# Patient Record
Sex: Female | Born: 1965 | Race: White | Hispanic: No | Smoking: Never smoker
Health system: Southern US, Community
[De-identification: ages and names within clinical notes are randomized; demographics above are authoritative.]

## PROBLEM LIST (undated history)

## (undated) DIAGNOSIS — J302 Other seasonal allergic rhinitis: Secondary | ICD-10-CM

## (undated) DIAGNOSIS — K589 Irritable bowel syndrome without diarrhea: Secondary | ICD-10-CM

## (undated) DIAGNOSIS — R51 Headache: Secondary | ICD-10-CM

## (undated) DIAGNOSIS — I739 Peripheral vascular disease, unspecified: Secondary | ICD-10-CM

## (undated) DIAGNOSIS — M797 Fibromyalgia: Secondary | ICD-10-CM

## (undated) DIAGNOSIS — M1990 Unspecified osteoarthritis, unspecified site: Secondary | ICD-10-CM

## (undated) DIAGNOSIS — E785 Hyperlipidemia, unspecified: Secondary | ICD-10-CM

## (undated) DIAGNOSIS — I2699 Other pulmonary embolism without acute cor pulmonale: Secondary | ICD-10-CM

## (undated) DIAGNOSIS — Q627 Congenital vesico-uretero-renal reflux: Secondary | ICD-10-CM

## (undated) DIAGNOSIS — I341 Nonrheumatic mitral (valve) prolapse: Secondary | ICD-10-CM

## (undated) HISTORY — PX: HERNIA REPAIR: SHX51

## (undated) HISTORY — PX: OTHER SURGICAL HISTORY: SHX169

## (undated) HISTORY — PX: DIAGNOSTIC LAPAROSCOPY: SUR761

## (undated) HISTORY — PX: WISDOM TOOTH EXTRACTION: SHX21

## (undated) HISTORY — PX: UPPER GASTROINTESTINAL ENDOSCOPY: SHX188

## (undated) HISTORY — PX: TONSILLECTOMY: SUR1361

---

## 1998-09-26 ENCOUNTER — Other Ambulatory Visit: Admission: RE | Admit: 1998-09-26 | Discharge: 1998-09-26 | Payer: Self-pay | Admitting: *Deleted

## 1998-10-19 ENCOUNTER — Ambulatory Visit (HOSPITAL_COMMUNITY): Admission: RE | Admit: 1998-10-19 | Discharge: 1998-10-19 | Payer: Self-pay | Admitting: *Deleted

## 1998-10-24 ENCOUNTER — Ambulatory Visit (HOSPITAL_COMMUNITY): Admission: RE | Admit: 1998-10-24 | Discharge: 1998-10-24 | Payer: Self-pay | Admitting: *Deleted

## 1998-10-24 ENCOUNTER — Encounter: Payer: Self-pay | Admitting: *Deleted

## 1999-01-14 ENCOUNTER — Emergency Department (HOSPITAL_COMMUNITY): Admission: EM | Admit: 1999-01-14 | Discharge: 1999-01-15 | Payer: Self-pay | Admitting: Emergency Medicine

## 1999-12-07 ENCOUNTER — Other Ambulatory Visit: Admission: RE | Admit: 1999-12-07 | Discharge: 1999-12-07 | Payer: Self-pay | Admitting: *Deleted

## 2000-07-01 ENCOUNTER — Other Ambulatory Visit: Admission: RE | Admit: 2000-07-01 | Discharge: 2000-07-01 | Payer: Self-pay | Admitting: *Deleted

## 2001-03-26 ENCOUNTER — Other Ambulatory Visit: Admission: RE | Admit: 2001-03-26 | Discharge: 2001-03-26 | Payer: Self-pay | Admitting: Obstetrics and Gynecology

## 2002-03-25 ENCOUNTER — Other Ambulatory Visit: Admission: RE | Admit: 2002-03-25 | Discharge: 2002-03-25 | Payer: Self-pay | Admitting: Obstetrics and Gynecology

## 2003-06-16 ENCOUNTER — Other Ambulatory Visit: Admission: RE | Admit: 2003-06-16 | Discharge: 2003-06-16 | Payer: Self-pay | Admitting: Obstetrics and Gynecology

## 2005-08-30 HISTORY — PX: COLONOSCOPY: SHX174

## 2005-09-23 ENCOUNTER — Ambulatory Visit (HOSPITAL_COMMUNITY): Admission: RE | Admit: 2005-09-23 | Discharge: 2005-09-23 | Payer: Self-pay | Admitting: *Deleted

## 2008-08-30 DIAGNOSIS — I739 Peripheral vascular disease, unspecified: Secondary | ICD-10-CM

## 2008-08-30 DIAGNOSIS — I2699 Other pulmonary embolism without acute cor pulmonale: Secondary | ICD-10-CM

## 2008-08-30 HISTORY — DX: Other pulmonary embolism without acute cor pulmonale: I26.99

## 2008-08-30 HISTORY — DX: Peripheral vascular disease, unspecified: I73.9

## 2008-09-01 ENCOUNTER — Encounter: Admission: RE | Admit: 2008-09-01 | Discharge: 2008-09-01 | Payer: Self-pay | Admitting: Family Medicine

## 2008-09-01 ENCOUNTER — Inpatient Hospital Stay (HOSPITAL_COMMUNITY): Admission: AD | Admit: 2008-09-01 | Discharge: 2008-09-03 | Payer: Self-pay | Admitting: Internal Medicine

## 2008-09-02 ENCOUNTER — Encounter (INDEPENDENT_AMBULATORY_CARE_PROVIDER_SITE_OTHER): Payer: Self-pay | Admitting: Internal Medicine

## 2008-09-02 ENCOUNTER — Ambulatory Visit: Payer: Self-pay | Admitting: Vascular Surgery

## 2008-10-13 ENCOUNTER — Inpatient Hospital Stay (HOSPITAL_COMMUNITY): Admission: EM | Admit: 2008-10-13 | Discharge: 2008-10-15 | Payer: Self-pay | Admitting: Emergency Medicine

## 2008-10-14 ENCOUNTER — Encounter (INDEPENDENT_AMBULATORY_CARE_PROVIDER_SITE_OTHER): Payer: Self-pay | Admitting: Internal Medicine

## 2008-10-14 ENCOUNTER — Ambulatory Visit: Payer: Self-pay | Admitting: Vascular Surgery

## 2009-01-18 ENCOUNTER — Encounter: Admission: RE | Admit: 2009-01-18 | Discharge: 2009-01-18 | Payer: Self-pay | Admitting: Family Medicine

## 2009-05-15 ENCOUNTER — Encounter: Admission: RE | Admit: 2009-05-15 | Discharge: 2009-05-15 | Payer: Self-pay | Admitting: Family Medicine

## 2010-10-22 ENCOUNTER — Encounter: Admission: RE | Admit: 2010-10-22 | Discharge: 2010-10-22 | Payer: Self-pay | Admitting: Family Medicine

## 2010-12-03 ENCOUNTER — Ambulatory Visit (HOSPITAL_COMMUNITY)
Admission: RE | Admit: 2010-12-03 | Discharge: 2010-12-03 | Payer: Self-pay | Source: Home / Self Care | Admitting: General Surgery

## 2010-12-03 HISTORY — PX: CHOLECYSTECTOMY: SHX55

## 2011-03-12 LAB — CBC
HCT: 40.4 % (ref 36.0–46.0)
Hemoglobin: 14 g/dL (ref 12.0–15.0)
MCHC: 34.7 g/dL (ref 30.0–36.0)
MCV: 86.3 fL (ref 78.0–100.0)

## 2011-03-12 LAB — BASIC METABOLIC PANEL
CO2: 30 mEq/L (ref 19–32)
Chloride: 104 mEq/L (ref 96–112)
GFR calc non Af Amer: >60 mL/min (ref >60)
Glucose, Bld: 102 mg/dL — ABNORMAL HIGH (ref 70–99)
Potassium: 4.4 mEq/L (ref 3.5–5.1)
Sodium: 140 mEq/L (ref 135–145)

## 2011-03-12 LAB — SURGICAL PCR SCREEN: MRSA, PCR: NEGATIVE

## 2011-05-14 NOTE — H&P (Signed)
Alyssa [name], Alyssa [name]              ACCOUNT NO.:  [id number]   MEDICAL RECORD NO.:  [id number]          PATIENT TYPE:  INP   LOCATION:  2031                         FACILITY:  [facility]   PHYSICIAN:  Hillery Aldo, M.D.   DATE OF BIRTH:  April 09, 1966   DATE OF ADMISSION:  10/13/2008  DATE OF DISCHARGE:                              HISTORY & PHYSICAL   PRIMARY CARE PHYSICIAN:  Maryelizabeth Rowan, MD   CHIEF COMPLAINT:  Chest pressure x2 days, dyspnea.   HISTORY OF PRESENT ILLNESS:  The patient is a 45 year old female with a  past medical history of pulmonary embolism that diagnosed back in the  early part of September who comes in with a chief complaint of a 2-day  history of chest heaviness accompanied by shortness of breath that is  similar to the symptoms she had when she was diagnosed with her  pulmonary emboli.  She has been maintained on Coumadin since that time  and is therapeutic upon testing of her INR here in the emergency  department.  The patient also endorses cough with deep inspiration that  is nonproductive.  She specifically denies any fever, chills, or  myalgias.  Her appetite has been normal; however, she has lost  approximately 5 pounds intentionally with increasing her fluid intake.  She denies any nausea or vomiting.  She had transient diarrhea couple of  nights ago, but none since that time.  She felt lightheaded today, but  no complaints of headache.  She denies any hematochezia or frank blood  in the stool, but notes that her stool has been dark since starting the  Coumadin.  The patient saw her primary care physician at the office  today for a routine checkup and because of her complaints of chest pain,  a 12-lead EKG was done at the office.  There were some abnormalities  noted including Q-waves in leads III and aVF which prompted her primary  care physician to send her to the emergency department for further  evaluation and workup.   PAST MEDICAL HISTORY:  1.  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  2. Gastroesophageal reflux disease with history of treated      Helicobacter pylori infection.  3. Hypertriglyceridemia, treated.  4. Irritable bowel syndrome.  5. Mitral valve prolapse.  6. Nasal septal deviation.  7. Deep venous thrombosis/pulmonary embolism diagnosed in September      2009.  8. Migraine headaches.   PAST SURGICAL HISTORY:  1. Inguinal hernia repair.  2. Tonsillectomy.  3. Pilonidal cyst resection.  4. Cyst removed from back.   FAMILY HISTORY:  Both parents are alive and well.  The father is being  treated for high cholesterol.  Her paternal grandfather had a MI.  She  has 1 sister with Crohn disease.  Her other siblings are healthy.   SOCIAL HISTORY:  The patient is married.  She denies any tobacco or drug  use.  She drinks a glass of wine approximately 3 times per week.  She  has a Best boy in Audiological scientist.   ALLERGIES:  IMITREX causes her throat to swell.  CURRENT MEDICATIONS:  1. Amoxicillin 500 mg p.r.n. before oral procedures.  2. Biotin 5000 mg daily.  3. Calcium 800 mg b.i.d.  4. Claritin 10 mg daily.  5. Fenofibrate 160 mg daily.  6. Midrin 325/65/100 one tablet q.i.d. p.r.n.  7. Multivitamin daily.  8. Veramyst 27.5 mcg 2 sprays in each nostril daily.  9. Vitamin D3 1000 international units daily.  10.Coumadin 5 mg daily except on Tuesdays and Thursdays when she takes      7.5 mg.  11.Fish oil 1 g daily.   REVIEW OF SYSTEMS:  Comprehensive 14-point review of systems was  completed with a pertinent positives and negatives noted in the elements  of the HPI above.   PHYSICAL EXAM:  VITAL SIGNS:  Temperature 98.1, pulse 78, respirations  22, blood pressure 123/82, and O2 saturation 98% on room air.  GENERAL:  Well-developed, well-nourished female in no acute distress.  HEENT:  Normocephalic, atraumatic.  PERRL.  EOMI.  Oropharynx is clear.  NECK:  Supple, no thyromegaly, no  lymphadenopathy, no jugular venous  distention.  CHEST:  Lungs are clear to auscultation bilaterally with good air  movement.  HEART:  Regular rate, rhythm.  No murmurs, rubs, or gallops.  ABDOMEN:  Soft, nontender, nondistended with normoactive bowel sounds.  EXTREMITIES:  No clubbing, edema, or cyanosis.  SKIN:  Warm and dry.  No rashes.  NEUROLOGIC:  The patient is alert and oriented x3.  Nonfocal.   Data reviewed.   Chest x-ray shows no acute cardiopulmonary disease.   A 12-lead EKG shows normal sinus rhythm with small Q-waves noted in  leads III and aVF.   LABORATORY DATA:  White blood cell count is 6.4, hemoglobin 13.9,  hematocrit 41.4, and platelets 247.  PT/INR was 36.3 and 3.3.  Sodium is  139, potassium 5.0, chloride 104, bicarb 28, BUN 15, creatinine 0.93,  glucose 95.  BNP is less than 30.  Point of care cardiac markers are  negative x1.   ASSESSMENT AND PLAN:  1. Chest pain.  The patient with known pulmonary embolism:  The      patient will be admitted and a repeat CT angiogram will be obtained      to determine if she has any increase in her clot burden.  We will      also check lower extremity Doppler to determine her clot burden and      if it is significant, would consider a Greenfield filter placement.      Although this is not likely acute coronary artery syndrome given      her relative age, and the fact that she is a menstruating female,      would go ahead and cycle cardiac markers q.8 h. x3 sets.  2. Gastroesophageal reflux disease:  The patient is not currently on      any antireflux medicines and will add Protonix in case reflux is      contributing to her chest pain.  3. Hypertriglyceridemia:  Continue the patient's fish oil and      fenofibrate.  4. Deep venous thrombosis/pulmonary embolism:  As noted above.  We      will have pharmacy dose her Coumadin.  5. Migraine headaches:  Continue the patient's Midrin as needed.  6. Prophylaxis:  The patient  is currently fully coumadinized with a      therapeutic INR.  We will add a proton pump inhibitor for treatment      of her gastroesophageal reflux and  gastrointestinal stress ulcer      prophylaxis.      Hillery Aldo, M.D.  Electronically Signed     CR/MEDQ  D:  10/13/2008  T:  10/13/2008  Job:  [id number]   cc:   Maryelizabeth Rowan, M.D.

## 2011-05-14 NOTE — H&P (Signed)
Alyssa [name], Alyssa [name]              ACCOUNT NO.:  [id number]   MEDICAL RECORD NO.:  [id number]          PATIENT TYPE:  INP   LOCATION:  5124                         FACILITY:  [facility]   PHYSICIAN:  Peggye Pitt, M.D. DATE OF BIRTH:  [birth year]   DATE OF ADMISSION:  09/01/2008  DATE OF DISCHARGE:                              HISTORY & PHYSICAL   PRIMARY CARE PHYSICIAN:  Dr. Duanne Guess.   CHIEF COMPLAINT:  Right leg pain and shortness of breath.   HISTORY OF PRESENT ILLNESS:  Ms. Maahs is a 45 year old white woman  who presents with right leg pain for 1 week and for the same time frame  she has also been having shortness of breath, which she attributed to  her allergies.  She has had no associated chest pain, diaphoresis,  palpitations, fever, chills, or any other symptoms.  She visited her PCP  today who sent her to [facility] to have a CT scan of her chest done,  which demonstrated bilateral PE, which is the reason for her admission.  Of note, on August 27, 2008, she was on a plane trip from [place], and she is on oral anti-contraceptive pills.   ALLERGIES:  She has stated allergies to [facility].   PAST MEDICAL HISTORY:  Nonsignificant.   MEDICATIONS:  She takes none.   SOCIAL HISTORY:  She is married.  Lives with her husband, who is an  Airline pilot, has no children.  Drinks a glass of red wine every night.  Denies any tobacco or illicit drug use.   FAMILY HISTORY:  Significant for a stroke in her uncle in his late 61s.  A maternal grandmother with coronary artery disease, but no history of  cancer or blood clots in the family.   REVIEW OF SYSTEMS:  A 10-system review of systems was negative except as  per HPI.   PHYSICAL EXAMINATION:  VITAL SIGNS:  Upon admission, blood pressure  137/90, heart rate 93, respirations 20, and O2 sats 98% on room air with  a temperature of 97.9.  GENERAL:  She is alert, awake, and oriented x3.  Very pleasant and in no  acute distress.  HEENT:  Normocephalic and atraumatic.  Her pupils are equal and reactive  to light and accommodation with intact extraocular movements.  Neck:  Supple with no JVD.  No lymphadenopathy.  No bruits or goiter.  LUNGS:  Clear to auscultation bilaterally.  CARDIOVASCULAR:  Regular rate and rhythm with no murmurs, rubs, or  gallops auscultated.  ABDOMEN:  Soft, nontender, and nondistended with positive bowel sounds.  EXTREMITIES:  She has about 1-2+ nonpitting edema over her right calf  and knee.  None over her left.  Her right calf is very tender to  palpation, and she has a positive Homans sign.   LABORATORIES UPON ADMISSION:  She has had none.   A CT scan, which the report is not up in e-chart yet, but has been read  as bilateral PEs.   ASSESSMENT AND PLAN:  1. For her bilateral pulmonary embolisms, which have been documented      by her CT  scan of her chest at this time, we will discontinue any      further hormonal contraception.  We will check hypercoagulable      panel.  We will start on warfarin and full-dose anticoagulation      with Lovenox.  We will bridge with Lovenox until her INR is      therapeutic for at least 2 days.  She is willing to give shots at      home, so we may be able to D/C her in the a.m.  Close follow up      with her PCP for INR checks.  2. For prophylaxis while in the hospital for deep venous thrombosis,      she will be replaced on full-dose anticoagulation for her pulmonary      embolism.  For gastrointestinal prophylaxis, we will place her on      Protonix.      Peggye Pitt, M.D.  Electronically Signed     EH/MEDQ  D:  09/01/2008  T:  09/02/2008  Job:  [id number]

## 2011-05-17 NOTE — Op Note (Signed)
Alyssa [name], Alyssa [name]               ACCOUNT NO.:  [id number]   MEDICAL RECORD NO.:  [id number]          PATIENT TYPE:  AMB   LOCATION:  ENDO                         FACILITY:  [facility]   PHYSICIAN:  Georgiana Spinner, M.D.    DATE OF BIRTH:  1966-01-10   DATE OF PROCEDURE:  09/23/2005  DATE OF DISCHARGE:                                 OPERATIVE REPORT   PROCEDURE:  Colonoscopy.   INDICATIONS:  Rectal bleeding.   ANESTHESIA:  Demerol 70, Versed 8 mg.   PROCEDURE:  With patient mildly sedated in the left lateral decubitus  position, the Olympus videoscopic colonoscope was inserted in the rectum and  passed under direct vision to the cecum, identified by ileocecal valve and  appendiceal orifice, both which were photographed.  From this point, the  colonoscope was slowly withdrawn taking circumferential views of colonic  mucosa, stopping only to wash and suction fecal debris until we reached the  rectum which appeared normal on direct and retroflexed view.  The endoscope  was straightened, withdrawn.  The patient's vital signs, pulse oximeter  remained stable.  The patient tolerated the procedure well without apparent  complications.   FINDINGS:  Rather unremarkable examination, limited mildly by prep.   PLAN:  Consider repeat examination in 5-10 years or as needed.           ______________________________  Georgiana Spinner, M.D.     GMO/MEDQ  D:  09/23/2005  T:  09/23/2005  Job:  [id number]

## 2011-05-17 NOTE — Discharge Summary (Signed)
NAMEZEOLA, BRYS              ACCOUNT NO.:  [id number]   MEDICAL RECORD NO.:  [id number]          PATIENT TYPE:  INP   LOCATION:  5124                         FACILITY:  [facility]   PHYSICIAN:  Lonia Blood, M.D.       DATE OF BIRTH:  May 13, 1966   DATE OF ADMISSION:  09/01/2008  DATE OF DISCHARGE:  09/03/2008                               DISCHARGE SUMMARY   PRIMARY CARE PHYSICIAN:  Maryelizabeth Rowan, MD   DISCHARGE DIAGNOSES:  1. Pulmonary emboli.  2. Deep venous thrombosis of the right lower extremity.  3. Migraine headaches.   DISCHARGE MEDICATIONS:  1. Lovenox 1 mg/kg body every 12 hours until PT/INR therapeutic.  2. Coumadin 5 mg at 6:00 p.m. daily.   CONDITION ON DISCHARGE:  Ms. Mian is discharged in good condition.  She is alert and oriented without any signs of respiratory distress.   The patient will follow up with her primary care physician Dr. Maryelizabeth Rowan the day after the discharge, which is September 04, 2008.   PROCEDURE DURING THIS ADMISSION:  1. Ms. Lipuma underwent a CT scan of the chest with intravenous      contrast, which was positive for bilateral pulmonary emboli.  2. The patient underwent lower extremity venous Dopplers, which were      positive for acute deep venous thrombosis in the posterior tibial      vein and popliteal vein to the distal femoral vein.   HISTORY AND PHYSICAL:  Refer to dictated H&P done by Dr. Ardyth Harps.   HOSPITAL COURSE:  Pulmonary emboli.  Ms. Krizek was admitted from the  emergency room after she presented with complaints of some shortness of  breath and chest pain.  She was diagnosed with bilateral pulmonary  emboli and was placed in the hospital on subcutaneous Lovenox and oral  Coumadin.  The patient had brief decrease in her thrombocyte count, but  then the platelet count recovered nicely and she was able to discharge  home in good  condition.  Ms. Sonnen had a hypercoagulable screen, which did not turn  positive  for protein C, protein S deficiency, and/or factor V mutation  or prothrombin II gene mutation.  The patient was also screened negative  for lupus anticoagulant presence.      Lonia Blood, M.D.  Electronically Signed     SL/MEDQ  D:  09/10/2008  T:  09/11/2008  Job:  [id number]   cc:   Maryelizabeth Rowan, M.D.

## 2011-10-01 LAB — CARDIAC PANEL(CRET KIN+CKTOT+MB+TROPI)
CK, MB: 0.7
Total CK: 70
Troponin I: 0.01

## 2011-10-01 LAB — CBC
HCT: 35.9 — ABNORMAL LOW
HCT: 41.1
MCV: 87.1
Platelets: 211
RBC: 4.72
RDW: 12.5
WBC: 6.4

## 2011-10-01 LAB — BASIC METABOLIC PANEL
BUN: 15
CO2: 28
Chloride: 104
Potassium: 5

## 2011-10-01 LAB — CK TOTAL AND CKMB (NOT AT ARMC)
CK, MB: 0.7
Relative Index: INVALID
Total CK: 76

## 2011-10-01 LAB — POCT CARDIAC MARKERS
CKMB, poc: <1 — ABNORMAL LOW
Troponin i, poc: <0.05

## 2011-10-01 LAB — DIFFERENTIAL
Eosinophils Absolute: 0.1
Eosinophils Relative: 1
Lymphs Abs: 2
Monocytes Relative: 10

## 2011-10-01 LAB — PROTIME-INR
INR: 2.3 — ABNORMAL HIGH
INR: 2.9 — ABNORMAL HIGH

## 2011-10-02 LAB — PROTIME-INR
INR: 1
INR: 1
Prothrombin Time: 13.9
Prothrombin Time: 15.1

## 2011-10-02 LAB — LUPUS ANTICOAGULANT PANEL
DRVVT: 53.1 — ABNORMAL HIGH (ref 36.1–47.0)
Lupus Anticoagulant: NOT DETECTED
dRVVT Incubated 1:1 Mix: 41.7 (ref 36.1–47.0)

## 2011-10-02 LAB — CBC
Hemoglobin: 11.9 — ABNORMAL LOW
MCHC: 33.9
MCHC: 35.1
MCV: 88.9
Platelets: 156
Platelets: 173
RBC: 3.98
RDW: 12
RDW: 12.1
RDW: 12.1

## 2011-10-02 LAB — PROTHROMBIN GENE MUTATION

## 2011-10-02 LAB — COMPREHENSIVE METABOLIC PANEL
ALT: 24
Albumin: 3.6
Alkaline Phosphatase: 48
Calcium: 9.1
Potassium: 3.9
Sodium: 140
Total Protein: 6.5

## 2011-10-02 LAB — PROTEIN C ACTIVITY: Protein C Activity: 153 % — ABNORMAL HIGH (ref 75–133)

## 2011-10-02 LAB — BETA-2-GLYCOPROTEIN I ABS, IGG/M/A: Beta-2 Glyco I IgG: <4 U/mL (ref <20)

## 2011-10-02 LAB — HOMOCYSTEINE: Homocysteine: 6.8

## 2011-10-02 LAB — CARDIOLIPIN ANTIBODIES, IGG, IGM, IGA: Anticardiolipin IgG: <7 — ABNORMAL LOW (ref <11)

## 2011-10-02 LAB — APTT: aPTT: 30

## 2011-12-11 ENCOUNTER — Encounter (HOSPITAL_COMMUNITY): Payer: Self-pay

## 2011-12-19 ENCOUNTER — Other Ambulatory Visit: Payer: Self-pay | Admitting: Obstetrics and Gynecology

## 2011-12-19 NOTE — Patient Instructions (Addendum)
   Your procedure is scheduled on: Thursday, Dec 27th  Enter through the Main Entrance of [facility] at: 10:00am Pick up the phone at the desk and dial [phone] and inform us of your arrival.  Please call this number if you have any problems the morning of surgery: [phone]  Remember: Do not eat food after midnight: Wednesday Do not drink clear liquids after: Wednesday Take these medicines the morning of surgery with a SIP OF WATER: allergy meds  Do not wear jewelry, make-up, or FINGER nail polish Do not wear lotions, powders, perfumes or deodorant. Do not shave 48 hours prior to surgery. Do not bring valuables to the hospital.  Patients discharged on the day of surgery will not be allowed to drive home.   Home with husband  Maurine Minister cell [phone]  Remember to use your hibiclens as instructed.Please shower with 1/2 bottle the evening before your surgery and the other 1/2 bottle the morning of surgery.

## 2011-12-20 ENCOUNTER — Encounter (HOSPITAL_COMMUNITY): Payer: Self-pay

## 2011-12-20 ENCOUNTER — Encounter (HOSPITAL_COMMUNITY)
Admission: RE | Admit: 2011-12-20 | Discharge: 2011-12-20 | Disposition: A | Discharge status: Home / Self Care | Payer: 59 | Source: Ambulatory Visit | Attending: Obstetrics and Gynecology | Admitting: Obstetrics and Gynecology

## 2011-12-20 HISTORY — DX: Irritable bowel syndrome, unspecified: K58.9

## 2011-12-20 HISTORY — DX: Headache: R51

## 2011-12-20 HISTORY — DX: Other seasonal allergic rhinitis: J30.2

## 2011-12-20 HISTORY — DX: Unspecified osteoarthritis, unspecified site: M19.90

## 2011-12-20 HISTORY — DX: Fibromyalgia: M79.7

## 2011-12-20 HISTORY — DX: Hyperlipidemia, unspecified: E78.5

## 2011-12-20 HISTORY — DX: Congenital vesico-uretero-renal reflux: Q62.7

## 2011-12-20 HISTORY — DX: Other pulmonary embolism without acute cor pulmonale: I26.99

## 2011-12-20 HISTORY — DX: Nonrheumatic mitral (valve) prolapse: I34.1

## 2011-12-20 HISTORY — DX: Peripheral vascular disease, unspecified: I73.9

## 2011-12-20 LAB — CBC
Platelets: 267 10*3/uL (ref 150–400)
RBC: 4.49 MIL/uL (ref 3.87–5.11)
RDW: 12.8 % (ref 11.5–15.5)
WBC: 5.5 10*3/uL (ref 4.0–10.5)

## 2011-12-20 NOTE — Pre-Procedure Instructions (Signed)
Reviewed Patient's history with Dr Rodman Pickle.  Ok to see DOS.  Only need a CBC.

## 2011-12-25 NOTE — H&P (Signed)
NAMESHELMA, [name]              ACCOUNT NO.:  [id number]  MEDICAL RECORD NO.:  [id number]  LOCATION:  PERIO                         FACILITY:  [facility]  PHYSICIAN:  Lenoard Aden, M.D.DATE OF BIRTH:  October 22, 1966  DATE OF ADMISSION:  12/05/2011 DATE OF DISCHARGE:                             HISTORY & PHYSICAL   CHIEF COMPLAINT:  Refractory menorrhagia.  HISTORY OF PRESENT ILLNESS:  The patient is a 45 year old white female, G1, P0, who presents for definitive therapy of refractory menorrhagia.  She has a surgical history remarkable for: 1. Cholecystectomy. 2. Laparoscopy. 3. Tonsillectomy. 4. Hernia repair. 5. Pilonidal cyst removal __________ in addition to a D and A.  She is a nonsmoker, nondrinker.  She denies domestic or physical violence.  FAMILY HISTORY:  Breast cancer, stroke, and prostate and lung cancer.  MEDICATIONS:  Include multivitamin, calcium, Midrin p.r.n., Singulair p.r.n., Veramyst, and Reglan p.r.n.  PHYSICAL EXAMINATION:  GENERAL:  The patient is a well-developed, well- nourished, white female, in no acute distress.  Height of 66 inches. Weight of 176 pounds. HEENT:  Normal. NECK:  Supple.  Full range of motion. LUNGS:  Clear. HEART:  Regular rhythm. ABDOMEN:  Soft, nontender. PELVIC:  There is a small anteflexed uterus. EXTREMITIES:  No cords. NEUROLOGIC:  Nonfocal. SKIN:  Intact.  IMPRESSION:  Refractory menorrhagia for definitive therapy.  PLAN:  Proceed with diagnostic hysteroscopy, D and C, NovaSure endometrial ablation.  Risks of anesthesia, infection, bleeding, injury to abdominal organs, need for repair was discussed, delayed versus immediate complications to include bowel or bladder injury noted.  The patient acknowledges and wishes to proceed.     Lenoard Aden, M.D.     RJT/MEDQ  D:  12/25/2011  T:  12/25/2011  Job:  [id number]

## 2011-12-26 ENCOUNTER — Ambulatory Visit (HOSPITAL_COMMUNITY)
Admission: RE | Admit: 2011-12-26 | Discharge: 2011-12-26 | Disposition: A | Discharge status: Home / Self Care | Payer: 59 | Source: Ambulatory Visit | Attending: Obstetrics and Gynecology | Admitting: Obstetrics and Gynecology

## 2011-12-26 ENCOUNTER — Encounter (HOSPITAL_COMMUNITY)
Admission: RE | Disposition: A | Discharge status: Home / Self Care | Payer: Self-pay | Source: Ambulatory Visit | Attending: Obstetrics and Gynecology

## 2011-12-26 ENCOUNTER — Other Ambulatory Visit: Payer: Self-pay | Admitting: Obstetrics and Gynecology

## 2011-12-26 ENCOUNTER — Encounter (HOSPITAL_COMMUNITY): Payer: Self-pay | Admitting: *Deleted

## 2011-12-26 ENCOUNTER — Encounter (HOSPITAL_COMMUNITY): Payer: Self-pay | Admitting: Anesthesiology

## 2011-12-26 ENCOUNTER — Ambulatory Visit (HOSPITAL_COMMUNITY): Payer: 59 | Admitting: Anesthesiology

## 2011-12-26 DIAGNOSIS — N920 Excessive and frequent menstruation with regular cycle: Secondary | ICD-10-CM | POA: Insufficient documentation

## 2011-12-26 LAB — HCG, SERUM, QUALITATIVE: Preg, Serum: NEGATIVE

## 2011-12-26 SURGERY — DILATATION & CURETTAGE/HYSTEROSCOPY WITH NOVASURE ABLATION
Anesthesia: General | Site: Vagina | Wound class: Clean Contaminated

## 2011-12-26 MED ORDER — MIDAZOLAM HCL 5 MG/5ML IJ SOLN
INTRAMUSCULAR | Status: DC | PRN
  Administered 2011-12-26: 2 mg via INTRAVENOUS

## 2011-12-26 MED ORDER — LIDOCAINE HCL (CARDIAC) 20 MG/ML IV SOLN
INTRAVENOUS | Status: AC
  Filled 2011-12-26: qty 5

## 2011-12-26 MED ORDER — FENTANYL CITRATE 0.05 MG/ML IJ SOLN
INTRAMUSCULAR | Status: AC
  Filled 2011-12-26: qty 2

## 2011-12-26 MED ORDER — FENTANYL CITRATE 0.05 MG/ML IJ SOLN
25.0000 ug | INTRAMUSCULAR | Status: DC | PRN
  Administered 2011-12-26: 50 ug via INTRAVENOUS

## 2011-12-26 MED ORDER — DEXAMETHASONE SODIUM PHOSPHATE 4 MG/ML IJ SOLN
INTRAMUSCULAR | Status: DC | PRN
  Administered 2011-12-26: 4 mg via INTRAVENOUS

## 2011-12-26 MED ORDER — CEFAZOLIN SODIUM 1-5 GM-% IV SOLN
1.0000 g | INTRAVENOUS | Status: AC
  Administered 2011-12-26: 1 g via INTRAVENOUS

## 2011-12-26 MED ORDER — DEXAMETHASONE SODIUM PHOSPHATE 10 MG/ML IJ SOLN
INTRAMUSCULAR | Status: AC
  Filled 2011-12-26: qty 1

## 2011-12-26 MED ORDER — DEXAMETHASONE SODIUM PHOSPHATE 4 MG/ML IJ SOLN: 8.0000 mg | Freq: Once | INTRAMUSCULAR | Status: DC | PRN

## 2011-12-26 MED ORDER — KETOROLAC TROMETHAMINE 30 MG/ML IJ SOLN: 15.0000 mg | Freq: Once | INTRAMUSCULAR | Status: DC | PRN

## 2011-12-26 MED ORDER — ONDANSETRON HCL 4 MG/2ML IJ SOLN
INTRAMUSCULAR | Status: AC
  Filled 2011-12-26: qty 2

## 2011-12-26 MED ORDER — FENTANYL CITRATE 0.05 MG/ML IJ SOLN
INTRAMUSCULAR | Status: AC
  Filled 2011-12-26: qty 4

## 2011-12-26 MED ORDER — MEPERIDINE HCL 25 MG/ML IJ SOLN: 6.2500 mg | INTRAMUSCULAR | Status: DC | PRN

## 2011-12-26 MED ORDER — PROPOFOL 10 MG/ML IV EMUL
INTRAVENOUS | Status: AC
  Filled 2011-12-26: qty 20

## 2011-12-26 MED ORDER — MIDAZOLAM HCL 2 MG/2ML IJ SOLN
INTRAMUSCULAR | Status: AC
  Filled 2011-12-26: qty 2

## 2011-12-26 MED ORDER — FENTANYL CITRATE 0.05 MG/ML IJ SOLN
INTRAMUSCULAR | Status: DC | PRN
  Administered 2011-12-26 (×3): 50 ug via INTRAVENOUS

## 2011-12-26 MED ORDER — OXYCODONE-ACETAMINOPHEN 5-325 MG PO TABS: 1.0000 | ORAL_TABLET | ORAL | Status: AC | PRN

## 2011-12-26 MED ORDER — LACTATED RINGERS IV SOLN
INTRAVENOUS | Status: DC
  Administered 2011-12-26 (×2): via INTRAVENOUS

## 2011-12-26 MED ORDER — KETOROLAC TROMETHAMINE 30 MG/ML IJ SOLN
INTRAMUSCULAR | Status: AC
  Filled 2011-12-26: qty 1

## 2011-12-26 MED ORDER — SCOPOLAMINE 1 MG/3DAYS TD PT72
MEDICATED_PATCH | TRANSDERMAL | Status: AC
  Administered 2011-12-26: 1 via TRANSDERMAL
  Filled 2011-12-26: qty 1

## 2011-12-26 MED ORDER — KETOROLAC TROMETHAMINE 30 MG/ML IJ SOLN
INTRAMUSCULAR | Status: DC | PRN
  Administered 2011-12-26: 30 mg via INTRAVENOUS

## 2011-12-26 MED ORDER — LACTATED RINGERS IR SOLN
Status: DC | PRN
  Administered 2011-12-26: 3000 mL

## 2011-12-26 MED ORDER — PROPOFOL 10 MG/ML IV EMUL
INTRAVENOUS | Status: DC | PRN
  Administered 2011-12-26: 150 mg via INTRAVENOUS

## 2011-12-26 MED ORDER — CEFAZOLIN SODIUM 1-5 GM-% IV SOLN
INTRAVENOUS | Status: AC
  Filled 2011-12-26: qty 50

## 2011-12-26 MED ORDER — BUPIVACAINE HCL (PF) 0.25 % IJ SOLN
INTRAMUSCULAR | Status: DC | PRN
  Administered 2011-12-26: 20 mL

## 2011-12-26 MED ORDER — LIDOCAINE HCL (CARDIAC) 20 MG/ML IV SOLN
INTRAVENOUS | Status: DC | PRN
  Administered 2011-12-26: 100 mg via INTRAVENOUS

## 2011-12-26 SURGICAL SUPPLY — 14 items
ABLATOR ENDOMETRIAL BIPOLAR (ABLATOR) ×2 IMPLANT
CATH ROBINSON RED A/P 16FR (CATHETERS) ×2 IMPLANT
CLOTH BEACON ORANGE TIMEOUT ST (SAFETY) ×2 IMPLANT
CONTAINER PREFILL 10% NBF 60ML (FORM) ×4 IMPLANT
GLOVE BIO SURGEON STRL SZ7.5 (GLOVE) ×4 IMPLANT
GOWN PREVENTION PLUS LG XLONG (DISPOSABLE) ×2 IMPLANT
GOWN PREVENTION PLUS XLARGE (GOWN DISPOSABLE) ×2 IMPLANT
GOWN STRL REIN XL XLG (GOWN DISPOSABLE) ×2 IMPLANT
NEEDLE SPNL 22GX3.5 QUINCKE BK (NEEDLE) ×2 IMPLANT
PACK HYSTEROSCOPY LF (CUSTOM PROCEDURE TRAY) ×2 IMPLANT
PAD PREP 24X48 CUFFED NSTRL (MISCELLANEOUS) ×2 IMPLANT
SYR TB 1ML 25GX5/8 (SYRINGE) ×2 IMPLANT
TOWEL OR 17X24 6PK STRL BLUE (TOWEL DISPOSABLE) ×4 IMPLANT
WATER STERILE IRR 1000ML POUR (IV SOLUTION) ×2 IMPLANT

## 2011-12-26 NOTE — Transfer of Care (Signed)
Immediate Anesthesia Transfer of Care Note  Patient: Alyssa [name]  Procedure(s) Performed:  DILATATION & CURETTAGE/HYSTEROSCOPY WITH NOVASURE ABLATION  Patient Location: PACU  Anesthesia Type: General  Level of Consciousness: awake, alert , oriented and patient cooperative  Airway & Oxygen Therapy: Patient Spontanous Breathing and Patient connected to nasal cannula oxygen  Post-op Assessment: Report given to PACU RN and Post -op Vital signs reviewed and stable  Post vital signs: Reviewed and stable  Complications: No apparent anesthesia complications

## 2011-12-26 NOTE — Anesthesia Preprocedure Evaluation (Signed)
Anesthesia Evaluation  Patient identified by MRN, date of birth, ID band Patient awake    Reviewed: Allergy & Precautions, H&P , NPO status , Patient's Chart, lab work & pertinent test results  Airway Mallampati: I TM Distance: >3 FB Neck ROM: full    Dental No notable dental hx. (+) Teeth Intact   Pulmonary neg pulmonary ROS,    Pulmonary exam normal       Cardiovascular neg cardio ROS     Neuro/Psych Negative Psych ROS   GI/Hepatic negative GI ROS, Neg liver ROS,   Endo/Other  Negative Endocrine ROS  Renal/GU negative Renal ROS  Genitourinary negative   Musculoskeletal   Abdominal Normal abdominal exam  (+)   Peds negative pediatric ROS (+)  Hematology negative hematology ROS (+)   Anesthesia Other Findings   Reproductive/Obstetrics negative OB ROS                           Anesthesia Physical Anesthesia Plan  ASA: II  Anesthesia Plan: General   Post-op Pain Management:    Induction: Intravenous  Airway Management Planned: LMA  Additional Equipment:   Intra-op Plan:   Post-operative Plan:   Informed Consent: I have reviewed the patients History and Physical, chart, labs and discussed the procedure including the risks, benefits and alternatives for the proposed anesthesia with the patient or authorized representative who has indicated his/her understanding and acceptance.     Plan Discussed with: CRNA  Anesthesia Plan Comments:         Anesthesia Quick Evaluation

## 2011-12-26 NOTE — Anesthesia Postprocedure Evaluation (Signed)
Anesthesia Post Note  Patient: Alyssa [name]  Procedure(s) Performed:  DILATATION & CURETTAGE/HYSTEROSCOPY WITH NOVASURE ABLATION  Anesthesia type: General  Patient location: PACU  Post pain: Pain level controlled  Post assessment: Post-op Vital signs reviewed  Last Vitals:  Filed Vitals:   12/26/11 0952  BP: 141/91  Pulse: 84  Temp: 36.7 C  Resp: 18    Post vital signs: Reviewed  Level of consciousness: sedated  Complications: No apparent anesthesia complicationsfj

## 2011-12-26 NOTE — Anesthesia Procedure Notes (Signed)
Procedure Name: LMA Insertion Performed by: Erskine Speed Pre-anesthesia Checklist: Patient identified, Patient being monitored, Emergency Drugs available, Timeout performed and Suction available Patient Re-evaluated:Patient Re-evaluated prior to inductionOxygen Delivery Method: Circle System Utilized Preoxygenation: Pre-oxygenation with 100% oxygen Intubation Type: Combination inhalational/ intravenous induction Ventilation: Mask ventilation without difficulty LMA: LMA flexible inserted LMA Size: 4.0 Number of attempts: 1 Airway Equipment and Method: patient positioned with wedge pillow

## 2011-12-26 NOTE — Preoperative (Signed)
Beta Blockers   Reason not to administer Beta Blockers:Not Applicable 

## 2011-12-26 NOTE — Progress Notes (Signed)
Patient ID: Alyssa [name], female   DOB: 1966-01-07, 45 y.o.   MRN: [id number] Consent done. Pt examined. Update done. No changes noted.

## 2011-12-26 NOTE — Op Note (Signed)
12/26/2011  11:13 AM  PATIENT:  Alyssa [name]  45 y.o. female  PRE-OPERATIVE DIAGNOSIS:  Menorrhagia(refractory)  POST-OPERATIVE DIAGNOSIS:  Same  PROCEDURE:  Procedure(s): DILATATION & CURETTAGE/HYSTEROSCOPY WITH NOVASURE ABLATION  SURGEON:  Surgeon(s): Lenoard Aden, MD  ASSISTANTS: none   ANESTHESIA:   local and general  ESTIMATED BLOOD LOSS: minimal Fluid deficit less than 50ml.  DRAINS: none   LOCAL MEDICATIONS USED:  MARCAINE 20CC  SPECIMEN:  Source of Specimen:  EMC  DISPOSITION OF SPECIMEN:  PATHOLOGY  COUNTS:  YES  DICTATION #: [id number]  PLAN OF CARE: DC home  PATIENT DISPOSITION:  PACU - hemodynamically stable.

## 2011-12-27 NOTE — Op Note (Signed)
[name], [name]              ACCOUNT NO.:  [id number]  MEDICAL RECORD NO.:  [id number]  LOCATION:  [facility]                          FACILITY:  [facility]  PHYSICIAN:  Lenoard Aden, M.D.DATE OF BIRTH:  [birth year]  DATE OF PROCEDURE:  12/26/2011 DATE OF DISCHARGE:  12/26/2011                              OPERATIVE REPORT   DESCRIPTION OF PROCEDURE:  After being apprised of risks of anesthesia, infection, bleeding, injury to abdominal organs, possible uterine perforation, and need for repair, the patient was brought the operating room and was administered general anesthetic without complications. Prepped and draped in sterile fashion.  Catheterized until the bladder was empty.  Exam under anesthesia reveals a small anteflexed uterus and no adnexal masses.  At this time, a dilute Marcaine solution was placed for a paracervical block and cervix is easily dilated up to a #23 Pratt dilator.  Hysteroscope placed.  Visualization reveals a normal endometrial cavity.  Endometrial curettings are collected using sharp curettage in a 4-quadrant method.  Sent to pathology.  The NovaSure device is then entered, set to a length of 5 cm, a width of 2.6 cm and initiated after the CO2 test is negative to a power of 72 watts for 1 minute and 40 seconds.  At the termination of the procedure,  NovaSure device was removed inspected and found to be intact.  Revisualization of endometrial cavity reveals a well-ablated endometrial cavity.  Good hemostasis was noted.  No evidence of uterine perforation.  All instruments were removed.  The patient tolerated the procedure well, was awakened, and transferred to recovery in good condition.     Lenoard Aden, M.D.     RJT/MEDQ  D:  12/26/2011  T:  12/27/2011  Job:  [id number]

## 2014-06-24 ENCOUNTER — Ambulatory Visit (HOSPITAL_COMMUNITY): Payer: BC Managed Care – PPO | Attending: Cardiology | Admitting: Cardiology

## 2014-06-24 ENCOUNTER — Other Ambulatory Visit (HOSPITAL_COMMUNITY): Payer: Self-pay | Admitting: Cardiology

## 2014-06-24 DIAGNOSIS — M79606 Pain in leg, unspecified: Secondary | ICD-10-CM

## 2014-06-24 DIAGNOSIS — M79609 Pain in unspecified limb: Secondary | ICD-10-CM | POA: Insufficient documentation

## 2014-06-24 DIAGNOSIS — Z86718 Personal history of other venous thrombosis and embolism: Secondary | ICD-10-CM | POA: Insufficient documentation

## 2014-06-24 DIAGNOSIS — M7989 Other specified soft tissue disorders: Secondary | ICD-10-CM

## 2014-06-24 NOTE — Progress Notes (Signed)
Rt. LE venous duplex performed. Prelim. Called to Dr. Chauncy Passyewey's office.

## 2016-04-08 DIAGNOSIS — R197 Diarrhea, unspecified: Secondary | ICD-10-CM | POA: Diagnosis not present

## 2016-04-22 DIAGNOSIS — D122 Benign neoplasm of ascending colon: Secondary | ICD-10-CM | POA: Diagnosis not present

## 2016-04-22 DIAGNOSIS — K640 First degree hemorrhoids: Secondary | ICD-10-CM | POA: Diagnosis not present

## 2016-04-22 DIAGNOSIS — R197 Diarrhea, unspecified: Secondary | ICD-10-CM | POA: Diagnosis not present

## 2016-04-22 DIAGNOSIS — K5730 Diverticulosis of large intestine without perforation or abscess without bleeding: Secondary | ICD-10-CM | POA: Diagnosis not present

## 2016-05-03 DIAGNOSIS — R197 Diarrhea, unspecified: Secondary | ICD-10-CM | POA: Diagnosis not present

## 2016-06-28 DIAGNOSIS — R197 Diarrhea, unspecified: Secondary | ICD-10-CM | POA: Diagnosis not present

## 2016-12-10 DIAGNOSIS — Z1231 Encounter for screening mammogram for malignant neoplasm of breast: Secondary | ICD-10-CM | POA: Diagnosis not present

## 2016-12-10 DIAGNOSIS — Z1151 Encounter for screening for human papillomavirus (HPV): Secondary | ICD-10-CM | POA: Diagnosis not present

## 2016-12-10 DIAGNOSIS — Z01419 Encounter for gynecological examination (general) (routine) without abnormal findings: Secondary | ICD-10-CM | POA: Diagnosis not present

## 2016-12-10 DIAGNOSIS — Z6830 Body mass index (BMI) 30.0-30.9, adult: Secondary | ICD-10-CM | POA: Diagnosis not present

## 2016-12-10 DIAGNOSIS — Z113 Encounter for screening for infections with a predominantly sexual mode of transmission: Secondary | ICD-10-CM | POA: Diagnosis not present

## 2017-10-13 DIAGNOSIS — E782 Mixed hyperlipidemia: Secondary | ICD-10-CM | POA: Diagnosis not present

## 2017-10-13 DIAGNOSIS — I10 Essential (primary) hypertension: Secondary | ICD-10-CM | POA: Diagnosis not present

## 2017-10-13 DIAGNOSIS — M179 Osteoarthritis of knee, unspecified: Secondary | ICD-10-CM | POA: Diagnosis not present

## 2017-10-13 DIAGNOSIS — Z6829 Body mass index (BMI) 29.0-29.9, adult: Secondary | ICD-10-CM | POA: Diagnosis not present

## 2017-10-13 DIAGNOSIS — Z23 Encounter for immunization: Secondary | ICD-10-CM | POA: Diagnosis not present

## 2017-11-10 DIAGNOSIS — Z0000 Encounter for general adult medical examination without abnormal findings: Secondary | ICD-10-CM | POA: Diagnosis not present

## 2017-11-10 DIAGNOSIS — Z13220 Encounter for screening for lipoid disorders: Secondary | ICD-10-CM | POA: Diagnosis not present

## 2017-11-10 DIAGNOSIS — Z1329 Encounter for screening for other suspected endocrine disorder: Secondary | ICD-10-CM | POA: Diagnosis not present

## 2017-11-10 DIAGNOSIS — Z114 Encounter for screening for human immunodeficiency virus [HIV]: Secondary | ICD-10-CM | POA: Diagnosis not present

## 2017-11-13 DIAGNOSIS — Z0000 Encounter for general adult medical examination without abnormal findings: Secondary | ICD-10-CM | POA: Diagnosis not present

## 2017-11-13 DIAGNOSIS — Z6825 Body mass index (BMI) 25.0-25.9, adult: Secondary | ICD-10-CM | POA: Diagnosis not present

## 2017-12-15 ENCOUNTER — Other Ambulatory Visit: Payer: Self-pay | Admitting: Family Medicine

## 2017-12-15 ENCOUNTER — Ambulatory Visit
Admission: RE | Admit: 2017-12-15 | Discharge: 2017-12-15 | Disposition: A | Discharge status: Home / Self Care | Payer: Self-pay | Source: Ambulatory Visit | Attending: Family Medicine | Admitting: Family Medicine

## 2017-12-15 DIAGNOSIS — T1490XA Injury, unspecified, initial encounter: Secondary | ICD-10-CM

## 2017-12-15 DIAGNOSIS — M19071 Primary osteoarthritis, right ankle and foot: Secondary | ICD-10-CM | POA: Diagnosis not present

## 2018-01-02 DIAGNOSIS — Z01419 Encounter for gynecological examination (general) (routine) without abnormal findings: Secondary | ICD-10-CM | POA: Diagnosis not present

## 2018-01-02 DIAGNOSIS — Z6828 Body mass index (BMI) 28.0-28.9, adult: Secondary | ICD-10-CM | POA: Diagnosis not present

## 2018-01-02 DIAGNOSIS — Z1231 Encounter for screening mammogram for malignant neoplasm of breast: Secondary | ICD-10-CM | POA: Diagnosis not present

## 2018-01-05 DIAGNOSIS — M179 Osteoarthritis of knee, unspecified: Secondary | ICD-10-CM | POA: Diagnosis not present

## 2018-05-28 ENCOUNTER — Other Ambulatory Visit: Payer: Self-pay | Admitting: Family Medicine

## 2018-05-28 ENCOUNTER — Ambulatory Visit
Admission: RE | Admit: 2018-05-28 | Discharge: 2018-05-28 | Disposition: A | Discharge status: Home / Self Care | Payer: BLUE CROSS/BLUE SHIELD | Source: Ambulatory Visit | Attending: Family Medicine | Admitting: Family Medicine

## 2018-05-28 DIAGNOSIS — M1711 Unilateral primary osteoarthritis, right knee: Secondary | ICD-10-CM | POA: Diagnosis not present

## 2018-05-28 DIAGNOSIS — R52 Pain, unspecified: Secondary | ICD-10-CM

## 2018-05-28 DIAGNOSIS — I10 Essential (primary) hypertension: Secondary | ICD-10-CM | POA: Diagnosis not present

## 2018-05-28 DIAGNOSIS — M25562 Pain in left knee: Secondary | ICD-10-CM | POA: Diagnosis not present

## 2018-05-28 DIAGNOSIS — E782 Mixed hyperlipidemia: Secondary | ICD-10-CM | POA: Diagnosis not present

## 2018-05-28 DIAGNOSIS — J3089 Other allergic rhinitis: Secondary | ICD-10-CM | POA: Diagnosis not present

## 2018-05-28 DIAGNOSIS — K219 Gastro-esophageal reflux disease without esophagitis: Secondary | ICD-10-CM | POA: Diagnosis not present

## 2018-07-21 ENCOUNTER — Other Ambulatory Visit: Payer: Self-pay

## 2018-07-21 ENCOUNTER — Encounter (HOSPITAL_BASED_OUTPATIENT_CLINIC_OR_DEPARTMENT_OTHER): Payer: Self-pay | Admitting: *Deleted

## 2018-07-21 ENCOUNTER — Emergency Department (HOSPITAL_BASED_OUTPATIENT_CLINIC_OR_DEPARTMENT_OTHER): Payer: BLUE CROSS/BLUE SHIELD

## 2018-07-21 ENCOUNTER — Emergency Department (HOSPITAL_BASED_OUTPATIENT_CLINIC_OR_DEPARTMENT_OTHER)
Admission: EM | Admit: 2018-07-21 | Discharge: 2018-07-21 | Disposition: A | Discharge status: Home / Self Care | Payer: BLUE CROSS/BLUE SHIELD | Attending: Emergency Medicine | Admitting: Emergency Medicine

## 2018-07-21 DIAGNOSIS — Y92488 Other paved roadways as the place of occurrence of the external cause: Secondary | ICD-10-CM | POA: Diagnosis not present

## 2018-07-21 DIAGNOSIS — Y999 Unspecified external cause status: Secondary | ICD-10-CM | POA: Insufficient documentation

## 2018-07-21 DIAGNOSIS — Y939 Activity, unspecified: Secondary | ICD-10-CM | POA: Diagnosis not present

## 2018-07-21 DIAGNOSIS — W010XXA Fall on same level from slipping, tripping and stumbling without subsequent striking against object, initial encounter: Secondary | ICD-10-CM | POA: Diagnosis not present

## 2018-07-21 DIAGNOSIS — M79672 Pain in left foot: Secondary | ICD-10-CM | POA: Diagnosis not present

## 2018-07-21 DIAGNOSIS — S99922A Unspecified injury of left foot, initial encounter: Secondary | ICD-10-CM | POA: Diagnosis not present

## 2018-07-21 MED ORDER — IBUPROFEN 800 MG PO TABS: 800.0000 mg | ORAL_TABLET | Freq: Three times a day (TID) | ORAL | 0 refills | Status: AC | PRN

## 2018-07-21 MED ORDER — IBUPROFEN 800 MG PO TABS: 800.0000 mg | ORAL_TABLET | Freq: Three times a day (TID) | ORAL | 0 refills | Status: DC | PRN

## 2018-07-21 MED ORDER — IBUPROFEN 800 MG PO TABS
800.0000 mg | ORAL_TABLET | Freq: Once | ORAL | Status: AC
  Administered 2018-07-21: 800 mg via ORAL
  Filled 2018-07-21: qty 1

## 2018-07-21 NOTE — ED Notes (Signed)
Patient given the cam walker to take home with her with instructions on how to properly use it. Patient refused to put on cam walker here due to pain, EDP made aware.

## 2018-07-21 NOTE — ED Triage Notes (Signed)
Pt reports slipping on wet pavement today. Fell onto left foot. No abrasions noted. Swelling and bruising noted.

## 2018-07-21 NOTE — ED Provider Notes (Signed)
Emergency Department Provider Note   I have reviewed the triage vital signs and the nursing notes.   HISTORY  Chief Complaint Foot Pain   HPI Alyssa [name] is a 52 y.o. female presents to the emergency department for evaluation of left foot pain after slip and fall on wet pavement.  She is noticed swelling to the left forefoot.  She reports a history of remote fracture in this foot and states this feels similar.  Describes the pain is severe and worse with movement.  No pain in the knee or ankle.  No numbness or tingling.  She has difficulty walking on the foot because of pain.  No fall or head trauma.    Past Medical History:  Diagnosis Date  . Arthritis    shoulder, hips, hands, feet - tx with aleve  . Congenital vesicoureteral reflux   . Fibromyalgia    shoulder, hips, hands, feet - tx with aleve  . Headache(784.0)   . Hyperlipidemia   . IBS (irritable bowel syndrome)    treats with diet  . MVP (mitral valve prolapse)    dx at age 31yrs old  . Peripheral vascular disease (HCC) 08/2008   right leg DVT   . Pulmonary embolism, bilateral (HCC) 08/2008   on coumadin x 1 year , no meds, no problems, wears TEDS  . Seasonal allergies     There are no active problems to display for this patient.   Past Surgical History:  Procedure Laterality Date  . CHOLECYSTECTOMY  12/03/10  . COLONOSCOPY  08/2005  . cyst removed     Pilonidal cyst resection  . DIAGNOSTIC LAPAROSCOPY    . HERNIA REPAIR     Inguinal hernia repair as child  . TONSILLECTOMY    . UPPER GASTROINTESTINAL ENDOSCOPY    . WISDOM TOOTH EXTRACTION      x 4 teeth    Allergies Sumatriptan succinate  History reviewed. No pertinent family history.  Social History Social History   Tobacco Use  . Smoking status: Never Smoker  . Smokeless tobacco: Never Used  Substance Use Topics  . Alcohol use: Yes    Alcohol/week: 4.2 oz    Types: 7 Glasses of wine per week    Comment: glass of wine with dinner  nightly  . Drug use: No    Review of Systems  Constitutional: No fever/chills Eyes: No visual changes. ENT: No sore throat. Cardiovascular: Denies chest pain. Respiratory: Denies shortness of breath. Gastrointestinal: No abdominal pain.  No nausea, no vomiting.  No diarrhea.  No constipation. Genitourinary: Negative for dysuria. Musculoskeletal: Negative for back pain. Positive foot pain.  Skin: Negative for rash. Neurological: Negative for headaches, focal weakness or numbness.  10-point ROS otherwise negative.  ____________________________________________   PHYSICAL EXAM:  VITAL SIGNS: ED Triage Vitals [07/21/18 2103]  Enc Vitals Group     BP 108/85     Pulse Rate 98     Resp 18     Temp 98.5 F (36.9 C)     Temp Source Oral     SpO2 94 %     Weight 185 lb (83.9 kg)     Height 5\' 6"  (1.676 m)     Pain Score 8   Constitutional: Alert and oriented. Well appearing and in no acute distress. Eyes: Conjunctivae are normal. Head: Atraumatic. Nose: No congestion/rhinnorhea. Mouth/Throat: Mucous membranes are moist. Neck: No stridor. Cardiovascular: Normal rate, regular rhythm. Normal DP/PT pulse on the left.  Respiratory: Normal respiratory  effort.  Gastrointestinal:  No distention.  Musculoskeletal: Left lateral foot pain with mild swelling. No bruising. No abrasion or laceration. No ankle or knee tenderness.  Neurologic:  Normal speech and language. No gross focal neurologic deficits are appreciated.  Skin:  Skin is warm, dry and intact. No rash noted.  ____________________________________________  RADIOLOGY  Dg Foot Complete Left  Result Date: 07/21/2018 CLINICAL DATA:  Slip and fall with foot pain, initial encounter EXAM: LEFT FOOT - COMPLETE 3+ VIEW COMPARISON:  None. FINDINGS: There are changes consistent with prior fifth metatarsal fracture with healing. No acute fracture or dislocation is noted. No soft tissue changes are seen mild tarsal degenerative  changes are noted as well as calcaneal spurring. IMPRESSION: Chronic changes without acute abnormality. Electronically Signed   By: Alcide CleverMark  Lukens M.D.   On: 07/21/2018 21:22    ____________________________________________   PROCEDURES  Procedure(s) performed:   Procedures  None ____________________________________________   INITIAL IMPRESSION / ASSESSMENT AND PLAN / ED COURSE  Pertinent labs & imaging results that were available during my care of the patient were reviewed by me and considered in my medical decision making (see chart for details).  Patient presents to the emergency department for evaluation of left foot pain and mild swelling.  She has no neurovascular compromise in the foot.  Plain film of the foot obtained which shows no acute fracture.  Patient was offered crutches and Cam walker boot but refused the boot.  Advised RICE and sports med f/u. WBAT. No ankle or knee tenderness on exam.   At this time, I do not feel there is any life-threatening condition present. I have reviewed and discussed all results (EKG, imaging, lab, urine as appropriate), exam findings with patient. I have reviewed nursing notes and appropriate previous records.  I feel the patient is safe to be discharged home without further emergent workup. Discussed usual and customary return precautions. Patient and family (if present) verbalize understanding and are comfortable with this plan.  Patient will follow-up with their primary care provider. If they do not have a primary care provider, information for follow-up has been provided to them. All questions have been answered.  ____________________________________________  FINAL CLINICAL IMPRESSION(S) / ED DIAGNOSES  Final diagnoses:  Foot pain, left     MEDICATIONS GIVEN DURING THIS VISIT:  Medications  ibuprofen (ADVIL,MOTRIN) tablet 800 mg (800 mg Oral Given 07/21/18 2202)     NEW OUTPATIENT MEDICATIONS STARTED DURING THIS VISIT:  Discharge  Medication List as of 07/21/2018 10:09 PM    START taking these medications   Details  ibuprofen (ADVIL,MOTRIN) 800 MG tablet Take 1 tablet (800 mg total) by mouth every 8 (eight) hours as needed., Starting Tue 07/21/2018, Normal        Note:  This document was prepared using Dragon voice recognition software and may include unintentional dictation errors.  Alona BeneJoshua Mcarthur Ivins, MD Emergency Medicine\    Mairlyn Tegtmeyer, Arlyss RepressJoshua G, MD 07/22/18 1044

## 2018-07-21 NOTE — Discharge Instructions (Addendum)
You were seen in the ED today with a foot injury. There is no new fracture on the x-ray today. This may be a sprain and will need to be treated with rest, ice, compression, and elevation. Take Tylenol and/or Motrin as needed for pain. Follow up with the sports medicine doctor listed if symptoms worsen for repeat x-rays if pain continues to be severe after two weeks.

## 2018-08-07 DIAGNOSIS — S93602D Unspecified sprain of left foot, subsequent encounter: Secondary | ICD-10-CM | POA: Diagnosis not present

## 2018-08-07 DIAGNOSIS — Z6829 Body mass index (BMI) 29.0-29.9, adult: Secondary | ICD-10-CM | POA: Diagnosis not present

## 2018-09-11 DIAGNOSIS — J069 Acute upper respiratory infection, unspecified: Secondary | ICD-10-CM | POA: Diagnosis not present

## 2018-09-11 DIAGNOSIS — Z23 Encounter for immunization: Secondary | ICD-10-CM | POA: Diagnosis not present

## 2018-09-11 DIAGNOSIS — Z6828 Body mass index (BMI) 28.0-28.9, adult: Secondary | ICD-10-CM | POA: Diagnosis not present

## 2018-09-11 DIAGNOSIS — R070 Pain in throat: Secondary | ICD-10-CM | POA: Diagnosis not present

## 2018-11-18 DIAGNOSIS — Z0000 Encounter for general adult medical examination without abnormal findings: Secondary | ICD-10-CM | POA: Diagnosis not present

## 2018-11-18 DIAGNOSIS — Z13220 Encounter for screening for lipoid disorders: Secondary | ICD-10-CM | POA: Diagnosis not present

## 2018-11-18 DIAGNOSIS — Z114 Encounter for screening for human immunodeficiency virus [HIV]: Secondary | ICD-10-CM | POA: Diagnosis not present

## 2018-11-18 DIAGNOSIS — Z1329 Encounter for screening for other suspected endocrine disorder: Secondary | ICD-10-CM | POA: Diagnosis not present

## 2018-11-20 DIAGNOSIS — I10 Essential (primary) hypertension: Secondary | ICD-10-CM | POA: Diagnosis not present

## 2018-11-20 DIAGNOSIS — E782 Mixed hyperlipidemia: Secondary | ICD-10-CM | POA: Diagnosis not present

## 2018-11-20 DIAGNOSIS — M79671 Pain in right foot: Secondary | ICD-10-CM | POA: Diagnosis not present

## 2018-11-20 DIAGNOSIS — Z23 Encounter for immunization: Secondary | ICD-10-CM | POA: Diagnosis not present

## 2018-11-20 DIAGNOSIS — Z1211 Encounter for screening for malignant neoplasm of colon: Secondary | ICD-10-CM | POA: Diagnosis not present

## 2018-11-20 DIAGNOSIS — Z0000 Encounter for general adult medical examination without abnormal findings: Secondary | ICD-10-CM | POA: Diagnosis not present

## 2018-11-20 DIAGNOSIS — Z6830 Body mass index (BMI) 30.0-30.9, adult: Secondary | ICD-10-CM | POA: Diagnosis not present

## 2018-11-20 DIAGNOSIS — D126 Benign neoplasm of colon, unspecified: Secondary | ICD-10-CM | POA: Diagnosis not present

## 2018-12-30 IMAGING — DX DG KNEE COMPLETE 4+V*R*
4 series · 4 of 4 positions shown · non-contrast
Comparison: Left knee performed today

CLINICAL DATA: Intermittent bilateral knee pain and swelling for 5
years.

EXAM:
RIGHT KNEE - COMPLETE 4+ VIEW

[dg knee complete 4 views right (1 of 4)]
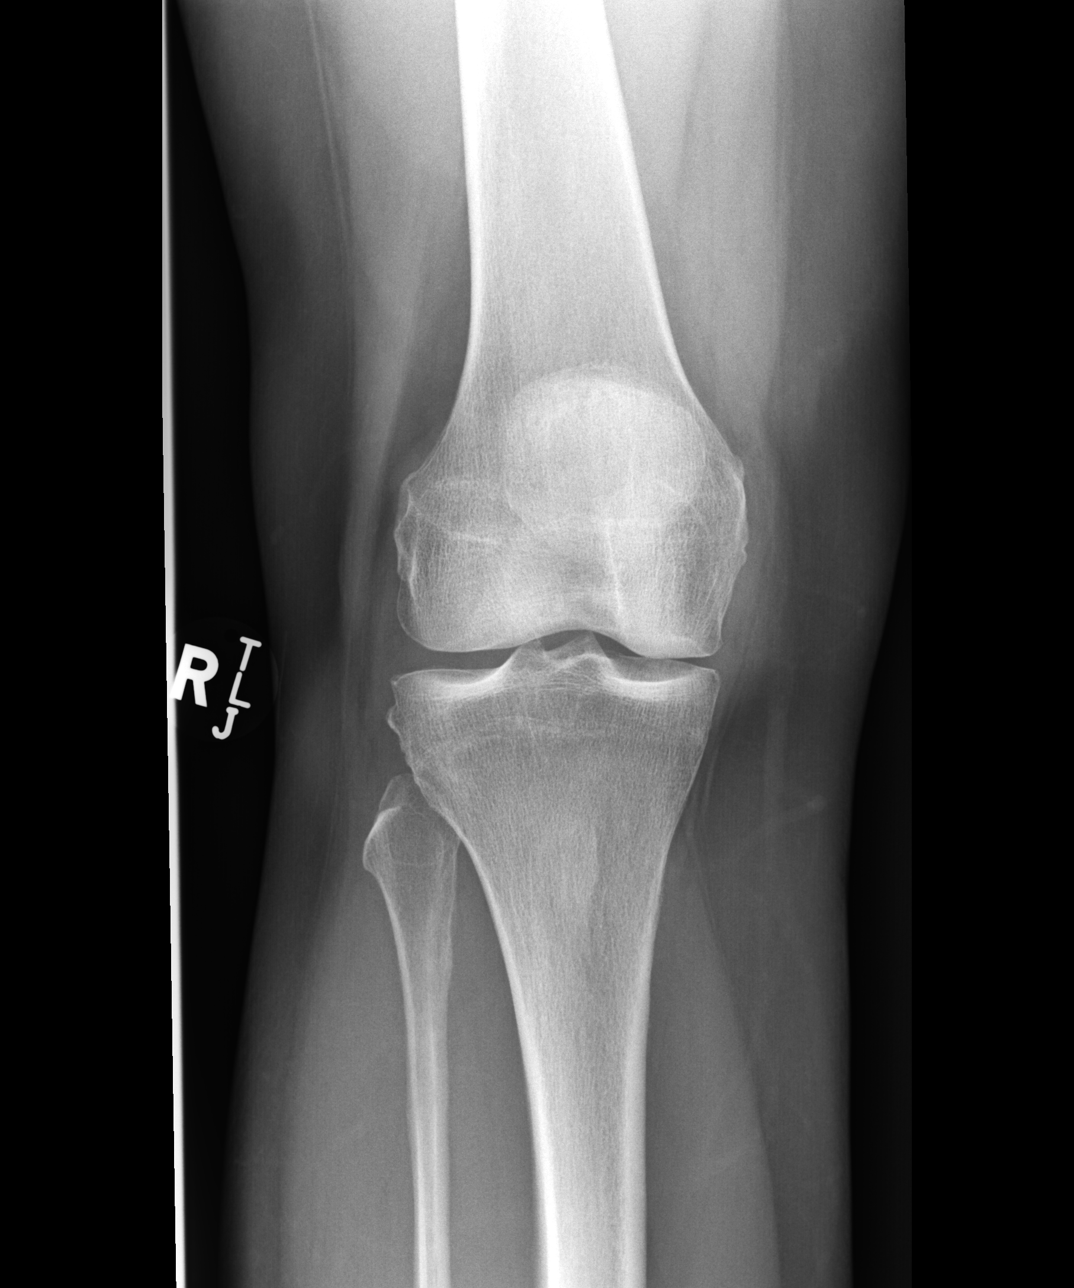

[dg knee complete 4 views right (2 of 4)]
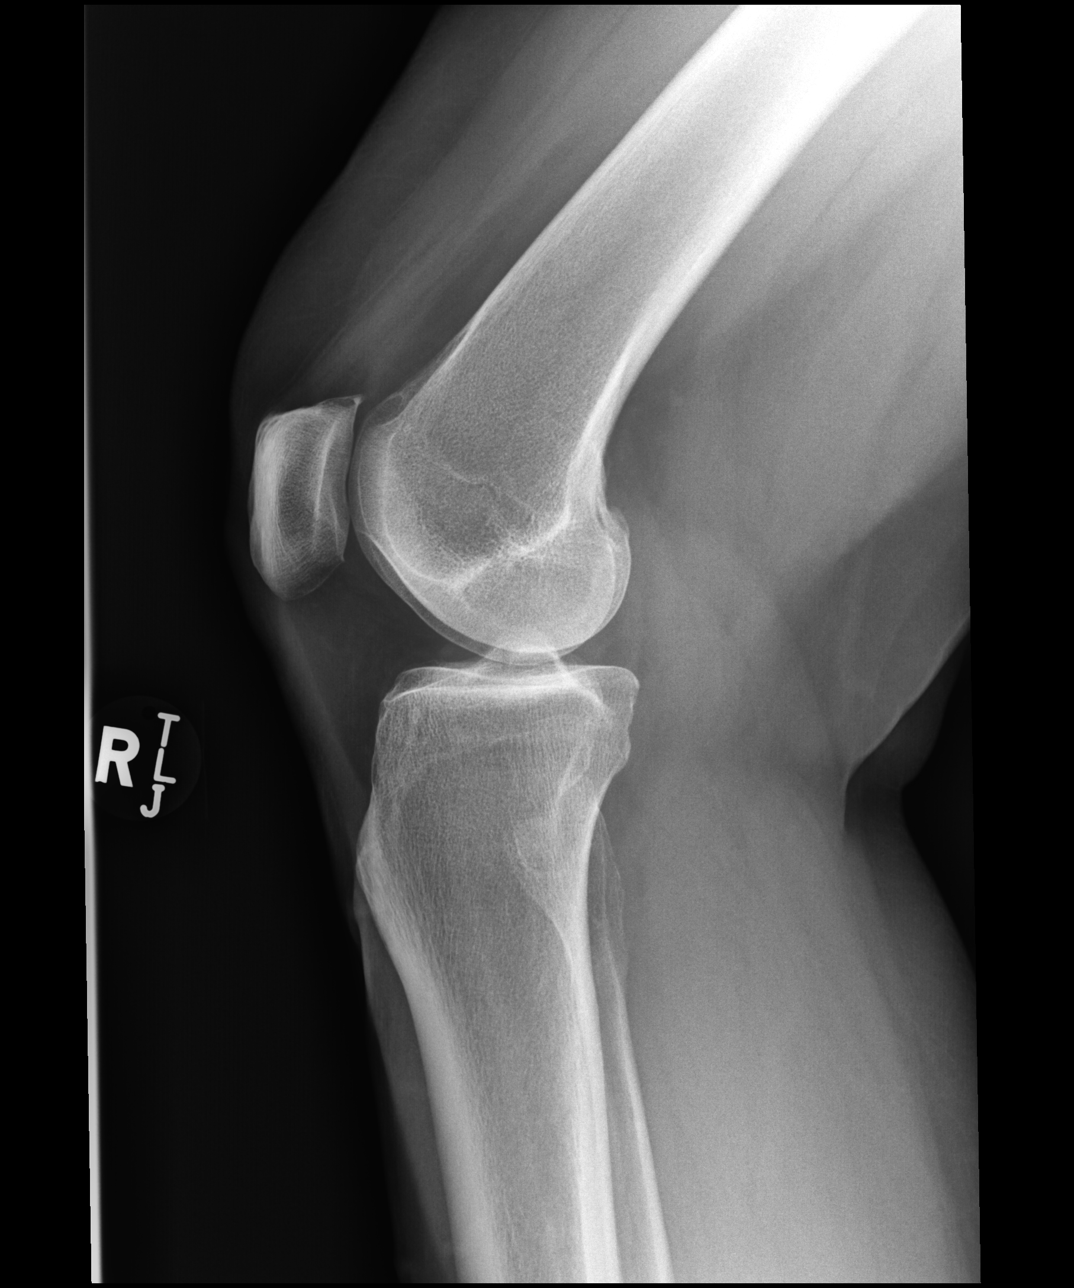

[dg knee complete 4 views right (3 of 4)]
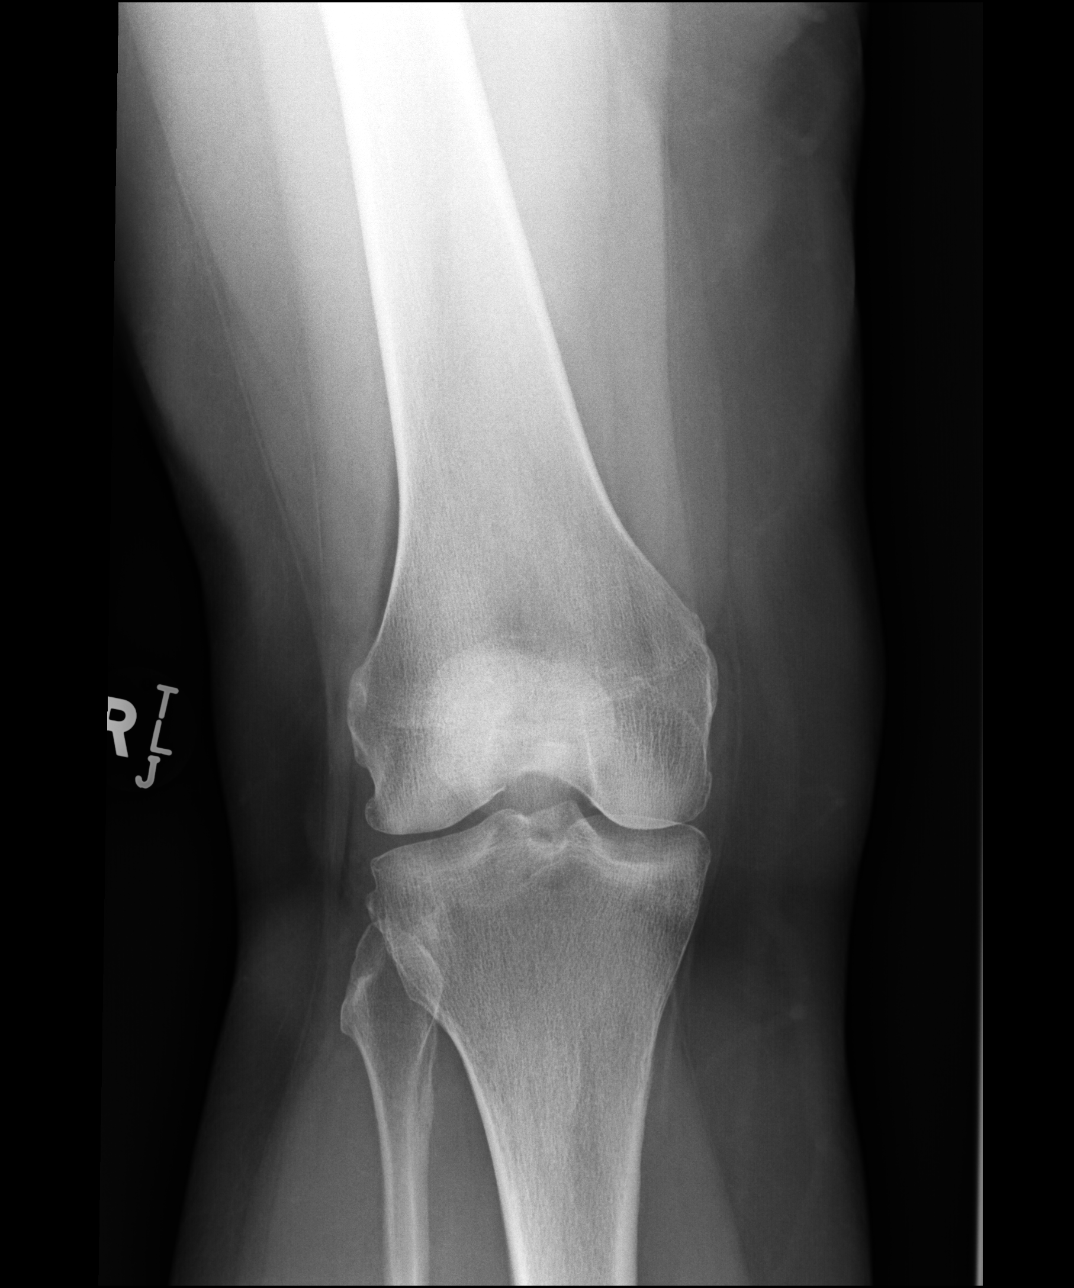

[dg knee complete 4 views right (4 of 4)]
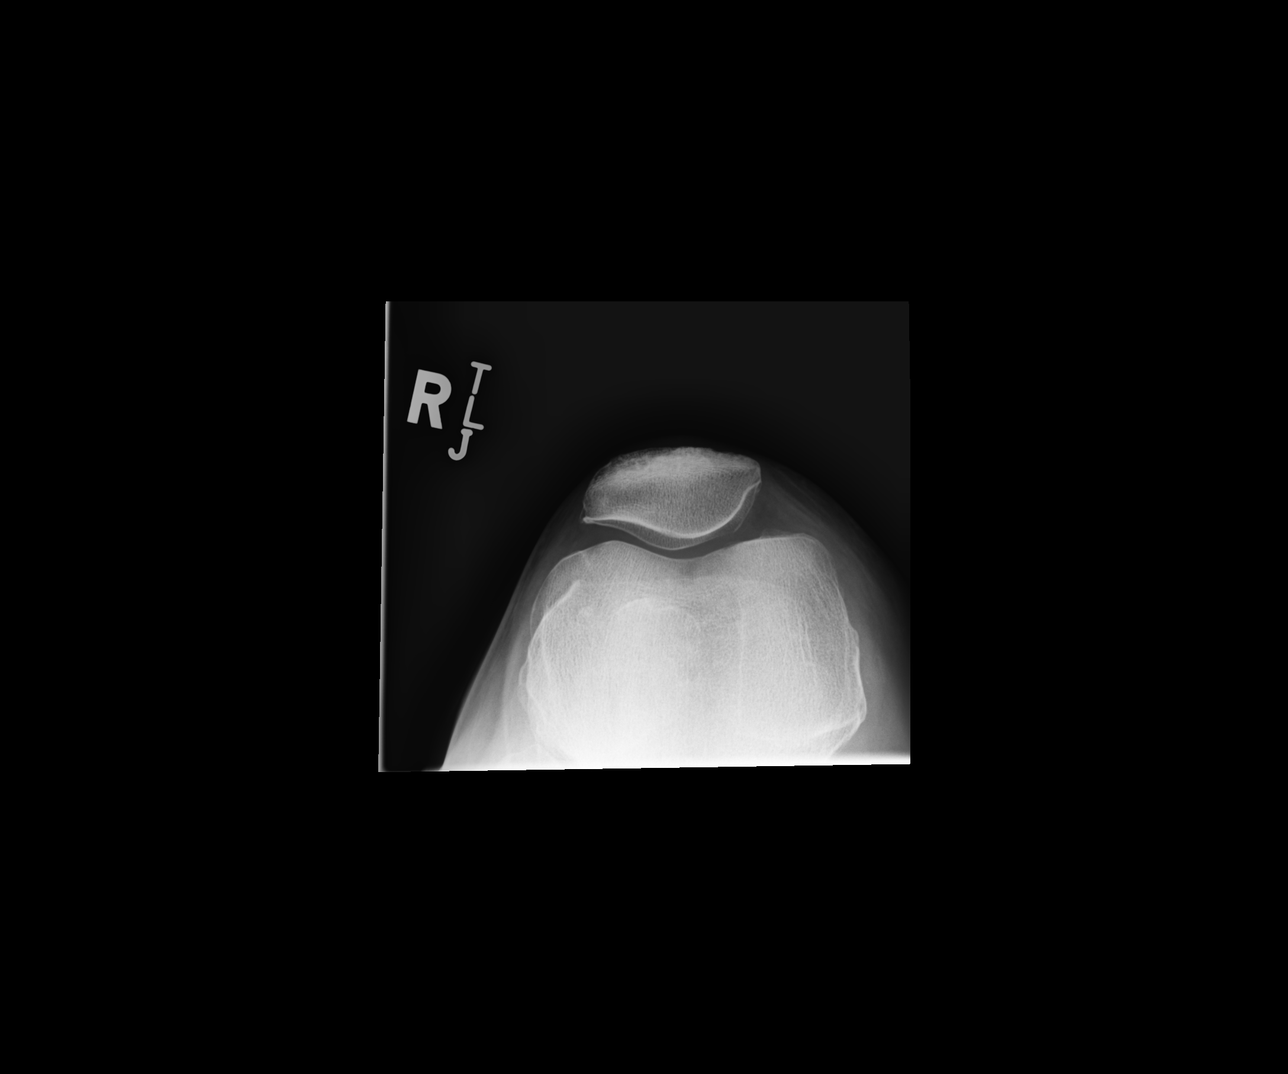

[4 of 4 positions shown; findings below may reference images not displayed]

FINDINGS: Early joint space narrowing within the right knee joint and early
spurring. No significant joint effusion. No acute bony abnormality.
Specifically, no fracture, subluxation, or dislocation.
IMPRESSION: Early osteoarthritic changes.  No acute bony abnormality.

## 2019-01-05 ENCOUNTER — Other Ambulatory Visit: Payer: Self-pay | Admitting: Family Medicine

## 2019-01-05 ENCOUNTER — Ambulatory Visit
Admission: RE | Admit: 2019-01-05 | Discharge: 2019-01-05 | Disposition: A | Discharge status: Home / Self Care | Payer: BLUE CROSS/BLUE SHIELD | Source: Ambulatory Visit | Attending: Family Medicine | Admitting: Family Medicine

## 2019-01-05 DIAGNOSIS — M79671 Pain in right foot: Secondary | ICD-10-CM | POA: Diagnosis not present

## 2019-01-05 DIAGNOSIS — G8911 Acute pain due to trauma: Secondary | ICD-10-CM

## 2019-01-05 DIAGNOSIS — M19071 Primary osteoarthritis, right ankle and foot: Secondary | ICD-10-CM | POA: Diagnosis not present

## 2019-01-05 DIAGNOSIS — I10 Essential (primary) hypertension: Secondary | ICD-10-CM | POA: Diagnosis not present

## 2019-01-05 DIAGNOSIS — E782 Mixed hyperlipidemia: Secondary | ICD-10-CM | POA: Diagnosis not present

## 2019-01-05 DIAGNOSIS — R739 Hyperglycemia, unspecified: Secondary | ICD-10-CM | POA: Diagnosis not present

## 2019-03-08 DIAGNOSIS — Z1151 Encounter for screening for human papillomavirus (HPV): Secondary | ICD-10-CM | POA: Diagnosis not present

## 2019-03-08 DIAGNOSIS — Z6830 Body mass index (BMI) 30.0-30.9, adult: Secondary | ICD-10-CM | POA: Diagnosis not present

## 2019-03-08 DIAGNOSIS — Z1231 Encounter for screening mammogram for malignant neoplasm of breast: Secondary | ICD-10-CM | POA: Diagnosis not present

## 2019-03-08 DIAGNOSIS — Z113 Encounter for screening for infections with a predominantly sexual mode of transmission: Secondary | ICD-10-CM | POA: Diagnosis not present

## 2019-03-08 DIAGNOSIS — Z01419 Encounter for gynecological examination (general) (routine) without abnormal findings: Secondary | ICD-10-CM | POA: Diagnosis not present

## 2019-09-09 DIAGNOSIS — R42 Dizziness and giddiness: Secondary | ICD-10-CM | POA: Diagnosis not present

## 2019-09-09 DIAGNOSIS — J3089 Other allergic rhinitis: Secondary | ICD-10-CM | POA: Diagnosis not present

## 2019-09-09 DIAGNOSIS — I10 Essential (primary) hypertension: Secondary | ICD-10-CM | POA: Diagnosis not present

## 2019-09-23 DIAGNOSIS — I10 Essential (primary) hypertension: Secondary | ICD-10-CM | POA: Diagnosis not present

## 2019-09-23 DIAGNOSIS — L255 Unspecified contact dermatitis due to plants, except food: Secondary | ICD-10-CM | POA: Diagnosis not present

## 2019-09-23 DIAGNOSIS — R42 Dizziness and giddiness: Secondary | ICD-10-CM | POA: Diagnosis not present

## 2019-10-07 DIAGNOSIS — I10 Essential (primary) hypertension: Secondary | ICD-10-CM | POA: Diagnosis not present

## 2019-10-07 DIAGNOSIS — R42 Dizziness and giddiness: Secondary | ICD-10-CM | POA: Diagnosis not present

## 2019-10-07 DIAGNOSIS — J3089 Other allergic rhinitis: Secondary | ICD-10-CM | POA: Diagnosis not present

## 2019-10-13 DIAGNOSIS — H6123 Impacted cerumen, bilateral: Secondary | ICD-10-CM | POA: Diagnosis not present

## 2019-10-13 DIAGNOSIS — R42 Dizziness and giddiness: Secondary | ICD-10-CM | POA: Diagnosis not present

## 2019-10-13 DIAGNOSIS — I10 Essential (primary) hypertension: Secondary | ICD-10-CM | POA: Diagnosis not present

## 2019-10-13 DIAGNOSIS — Z23 Encounter for immunization: Secondary | ICD-10-CM | POA: Diagnosis not present

## 2019-11-02 DIAGNOSIS — R42 Dizziness and giddiness: Secondary | ICD-10-CM | POA: Diagnosis not present

## 2019-11-02 DIAGNOSIS — J3089 Other allergic rhinitis: Secondary | ICD-10-CM | POA: Diagnosis not present

## 2019-11-02 DIAGNOSIS — I10 Essential (primary) hypertension: Secondary | ICD-10-CM | POA: Diagnosis not present

## 2019-11-02 DIAGNOSIS — M179 Osteoarthritis of knee, unspecified: Secondary | ICD-10-CM | POA: Diagnosis not present

## 2020-01-18 DIAGNOSIS — E559 Vitamin D deficiency, unspecified: Secondary | ICD-10-CM | POA: Diagnosis not present

## 2020-01-18 DIAGNOSIS — Z0000 Encounter for general adult medical examination without abnormal findings: Secondary | ICD-10-CM | POA: Diagnosis not present

## 2020-01-18 DIAGNOSIS — E782 Mixed hyperlipidemia: Secondary | ICD-10-CM | POA: Diagnosis not present

## 2020-01-20 DIAGNOSIS — R739 Hyperglycemia, unspecified: Secondary | ICD-10-CM | POA: Diagnosis not present

## 2020-01-20 DIAGNOSIS — Z0000 Encounter for general adult medical examination without abnormal findings: Secondary | ICD-10-CM | POA: Diagnosis not present

## 2020-01-20 DIAGNOSIS — Z6830 Body mass index (BMI) 30.0-30.9, adult: Secondary | ICD-10-CM | POA: Diagnosis not present

## 2020-08-16 DIAGNOSIS — H2513 Age-related nuclear cataract, bilateral: Secondary | ICD-10-CM | POA: Diagnosis not present

## 2020-08-16 DIAGNOSIS — H40013 Open angle with borderline findings, low risk, bilateral: Secondary | ICD-10-CM | POA: Diagnosis not present

## 2021-01-23 DIAGNOSIS — Z0000 Encounter for general adult medical examination without abnormal findings: Secondary | ICD-10-CM | POA: Diagnosis not present

## 2021-01-24 DIAGNOSIS — Z7289 Other problems related to lifestyle: Secondary | ICD-10-CM | POA: Diagnosis not present

## 2021-01-24 DIAGNOSIS — Z1211 Encounter for screening for malignant neoplasm of colon: Secondary | ICD-10-CM | POA: Diagnosis not present

## 2021-01-24 DIAGNOSIS — E782 Mixed hyperlipidemia: Secondary | ICD-10-CM | POA: Diagnosis not present

## 2021-01-24 DIAGNOSIS — Z0000 Encounter for general adult medical examination without abnormal findings: Secondary | ICD-10-CM | POA: Diagnosis not present

## 2021-01-24 DIAGNOSIS — Z23 Encounter for immunization: Secondary | ICD-10-CM | POA: Diagnosis not present

## 2021-01-24 DIAGNOSIS — R739 Hyperglycemia, unspecified: Secondary | ICD-10-CM | POA: Diagnosis not present

## 2021-01-24 DIAGNOSIS — R7989 Other specified abnormal findings of blood chemistry: Secondary | ICD-10-CM | POA: Diagnosis not present

## 2021-03-20 DIAGNOSIS — Z1231 Encounter for screening mammogram for malignant neoplasm of breast: Secondary | ICD-10-CM | POA: Diagnosis not present

## 2021-03-20 DIAGNOSIS — Z6831 Body mass index (BMI) 31.0-31.9, adult: Secondary | ICD-10-CM | POA: Diagnosis not present

## 2021-03-20 DIAGNOSIS — Z124 Encounter for screening for malignant neoplasm of cervix: Secondary | ICD-10-CM | POA: Diagnosis not present

## 2021-03-20 DIAGNOSIS — Z01419 Encounter for gynecological examination (general) (routine) without abnormal findings: Secondary | ICD-10-CM | POA: Diagnosis not present

## 2021-03-20 DIAGNOSIS — Z01411 Encounter for gynecological examination (general) (routine) with abnormal findings: Secondary | ICD-10-CM | POA: Diagnosis not present

## 2021-04-06 DIAGNOSIS — Z780 Asymptomatic menopausal state: Secondary | ICD-10-CM | POA: Diagnosis not present

## 2021-06-28 ENCOUNTER — Emergency Department (HOSPITAL_BASED_OUTPATIENT_CLINIC_OR_DEPARTMENT_OTHER)
Admission: EM | Admit: 2021-06-28 | Discharge: 2021-06-28 | Disposition: A | Discharge status: Home / Self Care | Payer: BC Managed Care – PPO | Attending: Emergency Medicine | Admitting: Emergency Medicine

## 2021-06-28 ENCOUNTER — Emergency Department (HOSPITAL_BASED_OUTPATIENT_CLINIC_OR_DEPARTMENT_OTHER): Payer: BC Managed Care – PPO

## 2021-06-28 ENCOUNTER — Encounter (HOSPITAL_BASED_OUTPATIENT_CLINIC_OR_DEPARTMENT_OTHER): Payer: Self-pay

## 2021-06-28 ENCOUNTER — Other Ambulatory Visit: Payer: Self-pay

## 2021-06-28 DIAGNOSIS — Y92009 Unspecified place in unspecified non-institutional (private) residence as the place of occurrence of the external cause: Secondary | ICD-10-CM | POA: Diagnosis not present

## 2021-06-28 DIAGNOSIS — W1789XA Other fall from one level to another, initial encounter: Secondary | ICD-10-CM | POA: Insufficient documentation

## 2021-06-28 DIAGNOSIS — S93402A Sprain of unspecified ligament of left ankle, initial encounter: Secondary | ICD-10-CM | POA: Diagnosis not present

## 2021-06-28 DIAGNOSIS — M7732 Calcaneal spur, left foot: Secondary | ICD-10-CM | POA: Diagnosis not present

## 2021-06-28 DIAGNOSIS — S99912A Unspecified injury of left ankle, initial encounter: Secondary | ICD-10-CM | POA: Diagnosis not present

## 2021-06-28 DIAGNOSIS — Y9339 Activity, other involving climbing, rappelling and jumping off: Secondary | ICD-10-CM | POA: Diagnosis not present

## 2021-06-28 DIAGNOSIS — M25572 Pain in left ankle and joints of left foot: Secondary | ICD-10-CM | POA: Diagnosis not present

## 2021-06-28 MED ORDER — IBUPROFEN 400 MG PO TABS
600.0000 mg | ORAL_TABLET | Freq: Once | ORAL | Status: AC
  Administered 2021-06-28: 600 mg via ORAL
  Filled 2021-06-28: qty 1

## 2021-06-28 NOTE — ED Triage Notes (Signed)
Pt states she fell/injured left ankle/foot ~230pm-NAD-to triage in w/c

## 2021-06-28 NOTE — ED Provider Notes (Signed)
[facility] EMERGENCY DEPARTMENT Provider Note   CSN: [id number] Arrival date & time: 06/28/21  1549     History Chief Complaint  Patient presents with   Ankle Injury    ANARIE KALISH is a 55 y.o. female.  HPI     DENIYA CRAIGO is a 55 y.o. female, with a history of PE (13 years ago), fibromyalgia, hyperlipidemia, presenting to the ED with injury to left ankle that occurred shortly prior to arrival.  Patient states she climbed onto a counter in her home to check a vent.  When she was getting down, she states she landed awkwardly on her left foot. She states she rolled her left ankle inward.  She has moderate left lateral ankle and foot pain accompanied by swelling and bruising. Denies anticoagulation currently. She denies head injury, neck/back pain, neck/back pain, chest injury, abdominal injury, other extremity pain/injury, or any other complaints.   Past Medical History:  Diagnosis Date   Arthritis    shoulder, hips, hands, feet - tx with aleve   Congenital vesicoureteral reflux    Fibromyalgia    shoulder, hips, hands, feet - tx with aleve   Headache(784.0)    Hyperlipidemia    IBS (irritable bowel syndrome)    treats with diet   MVP (mitral valve prolapse)    dx at age 20yrs old   Peripheral vascular disease (HCC) 08/2008   right leg DVT    Pulmonary embolism, bilateral (HCC) 08/2008   on coumadin x 1 year , no meds, no problems, wears TEDS   Seasonal allergies     There are no problems to display for this patient.   Past Surgical History:  Procedure Laterality Date   CHOLECYSTECTOMY  12/03/10   COLONOSCOPY  08/2005   cyst removed     Pilonidal cyst resection   DIAGNOSTIC LAPAROSCOPY     HERNIA REPAIR     Inguinal hernia repair as child   TONSILLECTOMY     UPPER GASTROINTESTINAL ENDOSCOPY     WISDOM TOOTH EXTRACTION      x 4 teeth     OB History   No obstetric history on file.     History reviewed. No pertinent family  history.  Social History   Tobacco Use   Smoking status: Never   Smokeless tobacco: Never  Substance Use Topics   Alcohol use: Not Currently    Comment: daily   Drug use: No    Home Medications Prior to Admission medications   Medication Sig Start Date End Date Taking? Authorizing Provider  azelastine (ASTELIN) 137 MCG/SPRAY nasal spray Place 1 spray into the nose 2 (two) times daily as needed. Use in each nostril as directed,for allergies     [provider]  BIOTIN PO Take 1 tablet by mouth daily.      [provider]  Calcium Carbonate-Vitamin D (CALCIUM + D PO) Take 1 tablet by mouth 2 (two) times daily. Has 800mg  calcium but unknown vit D strength     [provider]  Cholecalciferol (VITAMIN D) 2000 UNITS tablet Take 2,000 Units by mouth 2 (two) times daily.      [provider]  fenofibrate 160 MG tablet Take 160 mg by mouth daily.      [provider]  fexofenadine (ALLEGRA) 180 MG tablet Take 180 mg by mouth daily.      [provider]  fish oil-omega-3 fatty acids 1000 MG capsule Take 1 g by mouth 2 (two)  times daily.      [provider]  fluticasone (VERAMYST) 27.5 MCG/SPRAY nasal spray Place 2 sprays into the nose daily.      [provider]  ibuprofen (ADVIL,MOTRIN) 800 MG tablet Take 1 tablet (800 mg total) by mouth every 8 (eight) hours as needed. 07/21/18   Long, Arlyss Repress, MD  isometheptene-acetaminophen-dichloralphenazone (MIDRIN) [phone] MG capsule Take 1 capsule by mouth 4 (four) times daily as needed. For migraines     [provider]  montelukast (SINGULAIR) 10 MG tablet Take 10 mg by mouth at bedtime.      [provider]  Multiple Vitamins-Minerals (MULTIVITAMINS THER. W/MINERALS) TABS Take 1 tablet by mouth daily. Takes centrum silver     [provider]  naproxen sodium (ALEVE) 220 MG tablet Take 440 mg by mouth 2 (two) times daily as needed. Prn pain or headache     [provider]  rosuvastatin (CRESTOR) 10 MG tablet Take 10 mg by mouth daily.    [provider]    Allergies    Sumatriptan succinate  Review of Systems   Review of Systems  Constitutional:  Negative for diaphoresis.  Respiratory:  Negative for shortness of breath.   Cardiovascular:  Negative for chest pain.  Gastrointestinal:  Negative for abdominal pain, nausea and vomiting.  Musculoskeletal:  Positive for arthralgias. Negative for back pain and neck pain.  Neurological:  Negative for weakness and numbness.   Physical Exam Updated Vital Signs BP (!) 172/91 (BP Location: Right Arm)   Pulse 96   Temp 98.2 F (36.8 C) (Oral)   Resp 18   Ht 5\' 6"  (1.676 m)   Wt 88.5 kg   SpO2 98%   BMI 31.47 kg/m   Physical Exam Vitals and nursing note reviewed.  Constitutional:      General: She is not in acute distress.    Appearance: Normal appearance. She is well-developed. She is not diaphoretic.  HENT:     Head: Normocephalic and atraumatic.  Eyes:     Conjunctiva/sclera: Conjunctivae normal.  Cardiovascular:     Rate and Rhythm: Normal rate and regular rhythm.  Pulmonary:     Effort: Pulmonary effort is normal.  Musculoskeletal:     Cervical back: Neck supple.     Comments: Tenderness, swelling, bruising to the left lateral malleolus and left lateral foot.  No noted deformity, instability. No tenderness, pain, swelling, deformity, instability, color abnormality to the rest of the left foot, lower leg, knee, upper leg, or hip. The other extremities were also examined and joints were ranged without evidence of additional injury. No midline spinal tenderness.  Skin:    General: Skin is warm and dry.     Coloration: Skin is not pale.  Neurological:     Mental Status: She is alert.     Comments: Sensation light touch grossly intact in the left foot and toes. Appropriate motor function intact in the left toes, ankle, and knee.  Appropriate motor function  intact in the other extremities as well.  Psychiatric:        Behavior: Behavior normal.    ED Results / Procedures / Treatments   Labs (all labs ordered are listed, but only abnormal results are displayed) Labs Reviewed - No data to display  EKG None  Radiology DG Ankle Complete Left  Result Date: 06/28/2021 CLINICAL DATA:  Recent fall with left ankle pain, initial encounter EXAM: LEFT ANKLE COMPLETE - 3+ VIEW COMPARISON:  07/21/2018 FINDINGS: Small calcaneal  spur is noted. No acute fracture or dislocation is noted. No soft tissue abnormality is seen. IMPRESSION: Mild degenerative change without acute abnormality. Electronically Signed   By: Alcide Clever M.D.   On: 06/28/2021 16:59   DG Foot Complete Left  Result Date: 06/28/2021 CLINICAL DATA:  Recent fall with foot pain, initial encounter EXAM: LEFT FOOT - COMPLETE 3+ VIEW COMPARISON:  07/21/2018 FINDINGS: Mild calcaneal spurring is noted. Old fifth metatarsal fracture is seen with healing. No acute fracture is noted. No soft tissue abnormality is seen. IMPRESSION: No acute abnormality noted. Electronically Signed   By: Alcide Clever M.D.   On: 06/28/2021 17:00    Procedures Procedures   Medications Ordered in ED Medications  ibuprofen (ADVIL) tablet 600 mg (600 mg Oral Given 06/28/21 1617)    ED Course  I have reviewed the triage vital signs and the nursing notes.  Pertinent labs & imaging results that were available during my care of the patient were reviewed by me and considered in my medical decision making (see chart for details).    MDM Rules/Calculators/A&P                          Patient presents with injury to the left foot and ankle. No evidence of neurovascular compromise. I personally reviewed and interpreted the patient's x-rays. No acute osseous abnormalities on x-ray. Patient placed in a cam boot.  She brought crutches with her.  These were adjusted for better fit. Although patient has no evidence of  fracture on x-ray, risks of PE/DVT were discussed with the patient.  She was advised of the importance of moving around throughout the day and reducing immobility.  We refreshed symptoms of DVT/PE and advised the patient to seek immediate medical attention should she experience these.   Final Clinical Impression(s) / ED Diagnoses Final diagnoses:  Injury of left ankle, initial encounter    Rx / DC Orders ED Discharge Orders     None        Concepcion Living 06/28/21 1825    Virgina Norfolk, DO 06/28/21 2323

## 2021-06-28 NOTE — Discharge Instructions (Addendum)
You have been seen today for an ankle/foot injury. There were no acute abnormalities on the x-rays, including no sign of fracture or dislocation, however, there could be injuries to the soft tissues, such as the ligaments or tendons that are not seen on xrays. There could also be what are called occult fractures that are small fractures not seen on xray. Antiinflammatory medications: Take 600 mg of ibuprofen every 6 hours or 440 mg (over the counter dose) to 500 mg (prescription dose) of naproxen every 12 hours for the next 3 days. After this time, these medications may be used as needed for pain. Take these medications with food to avoid upset stomach. Choose only one of these medications, do not take them together. Acetaminophen (generic for Tylenol): Should you continue to have additional pain while taking the ibuprofen or naproxen, you may add in acetaminophen as needed. Your daily total maximum amount of acetaminophen from all sources should be limited to 4000mg /day for persons without liver problems, or 2000mg /day for those with liver problems. Ice: May apply ice to the area over the next 24 hours for 15 minutes at a time to reduce swelling. Elevation: Keep the extremity elevated as often as possible to reduce pain and inflammation. Support: Wear the cam boot for support and comfort. Wear this until pain resolves. You will be weight-bearing as tolerated, which means you can slowly start to put weight on the extremity and increase amount and frequency as pain allows. Follow up: If symptoms are improving, you may follow up with your primary care provider for any continued management. If symptoms are not starting to improve within a week, you should follow up with the orthopedic specialist within two weeks. Return: Return to the ED for numbness, weakness, increasing pain, overall worsening symptoms, loss of function, or if symptoms are not improving, you have tried to follow up with the orthopedic  specialist, and have been unable to do so.  For prescription assistance, may try using prescription discount sites or apps, such as goodrx.com or Good Rx smart phone app.

## 2021-07-12 DIAGNOSIS — U071 COVID-19: Secondary | ICD-10-CM | POA: Diagnosis not present

## 2022-01-16 DIAGNOSIS — H40013 Open angle with borderline findings, low risk, bilateral: Secondary | ICD-10-CM | POA: Diagnosis not present

## 2022-01-16 DIAGNOSIS — H2513 Age-related nuclear cataract, bilateral: Secondary | ICD-10-CM | POA: Diagnosis not present

## 2022-01-25 DIAGNOSIS — Z0000 Encounter for general adult medical examination without abnormal findings: Secondary | ICD-10-CM | POA: Diagnosis not present

## 2022-01-29 DIAGNOSIS — E782 Mixed hyperlipidemia: Secondary | ICD-10-CM | POA: Diagnosis not present

## 2022-01-29 DIAGNOSIS — Z23 Encounter for immunization: Secondary | ICD-10-CM | POA: Diagnosis not present

## 2022-01-29 DIAGNOSIS — R739 Hyperglycemia, unspecified: Secondary | ICD-10-CM | POA: Diagnosis not present

## 2022-01-29 DIAGNOSIS — Z1211 Encounter for screening for malignant neoplasm of colon: Secondary | ICD-10-CM | POA: Diagnosis not present

## 2022-01-29 DIAGNOSIS — Z0000 Encounter for general adult medical examination without abnormal findings: Secondary | ICD-10-CM | POA: Diagnosis not present

## 2022-01-30 IMAGING — DX DG FOOT COMPLETE 3+V*L*
3 series · 3 of 3 positions shown · non-contrast
Comparison: 07/21/2018

CLINICAL DATA: Recent fall with foot pain, initial encounter

EXAM:
LEFT FOOT - COMPLETE 3+ VIEW

[foot lat]
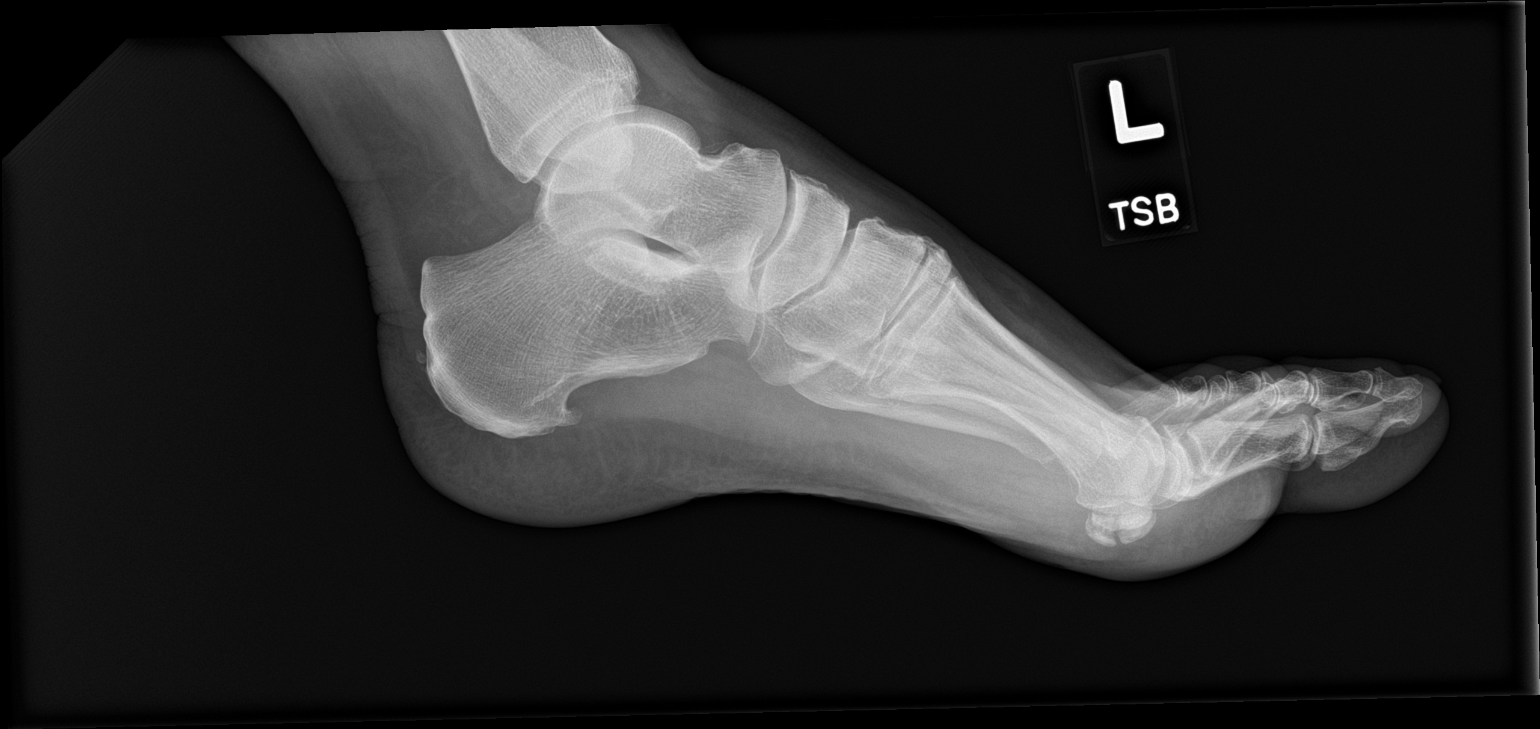

[foot ap]
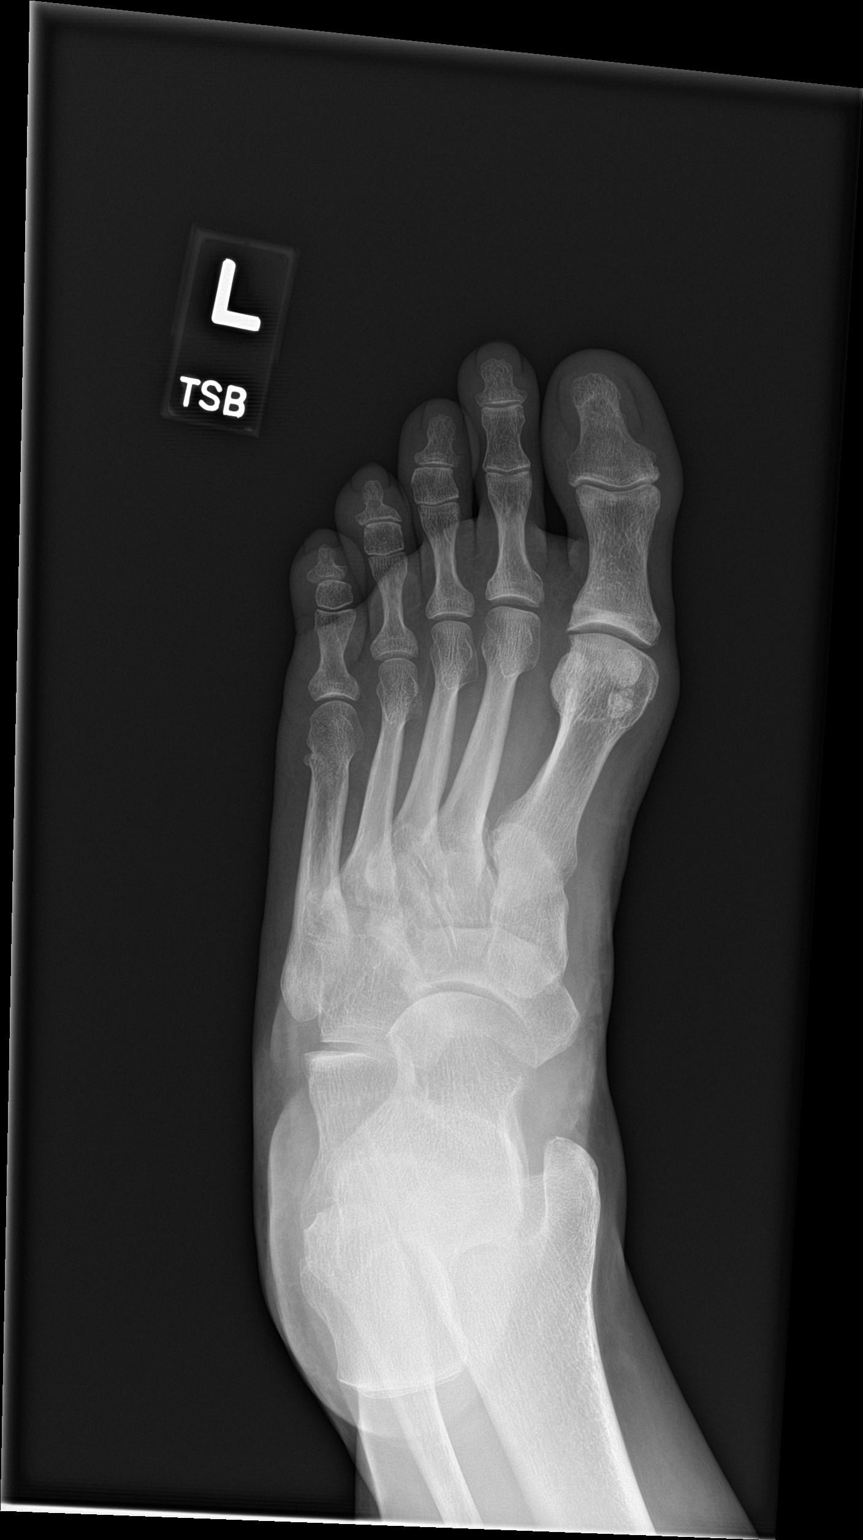

[foot obl]
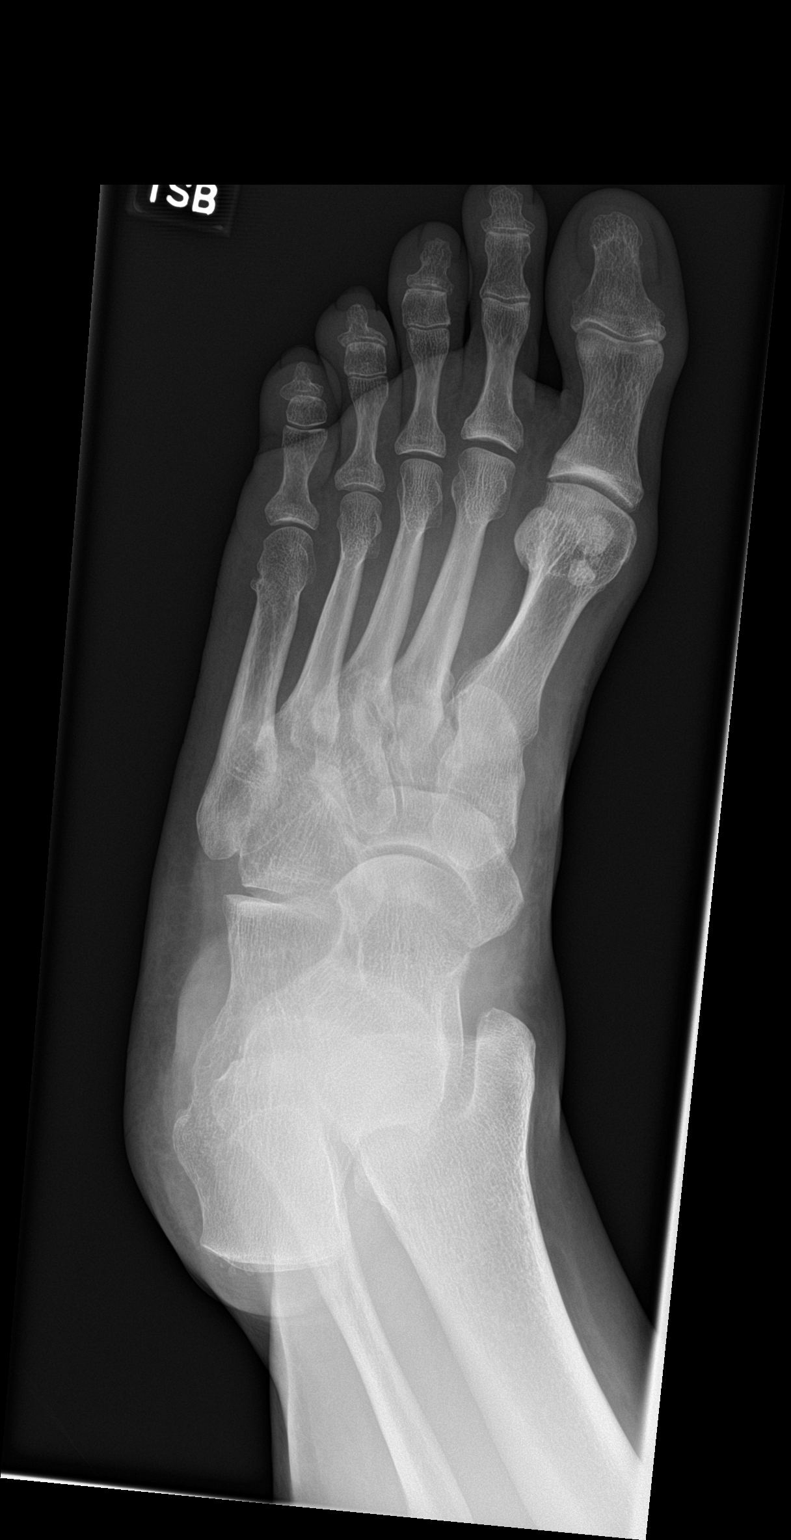

[3 of 3 positions shown; findings below may reference images not displayed]

FINDINGS: Mild calcaneal spurring is noted. Old fifth metatarsal fracture is
seen with healing. No acute fracture is noted. No soft tissue
abnormality is seen.
IMPRESSION: No acute abnormality noted.

## 2022-02-13 DIAGNOSIS — I10 Essential (primary) hypertension: Secondary | ICD-10-CM | POA: Diagnosis not present

## 2022-02-13 DIAGNOSIS — E669 Obesity, unspecified: Secondary | ICD-10-CM | POA: Diagnosis not present

## 2022-02-13 DIAGNOSIS — H8110 Benign paroxysmal vertigo, unspecified ear: Secondary | ICD-10-CM | POA: Diagnosis not present

## 2022-02-13 DIAGNOSIS — R7303 Prediabetes: Secondary | ICD-10-CM | POA: Diagnosis not present

## 2022-06-05 DIAGNOSIS — R9431 Abnormal electrocardiogram [ECG] [EKG]: Secondary | ICD-10-CM | POA: Diagnosis not present

## 2022-06-05 DIAGNOSIS — R0789 Other chest pain: Secondary | ICD-10-CM | POA: Diagnosis not present

## 2022-06-05 DIAGNOSIS — I10 Essential (primary) hypertension: Secondary | ICD-10-CM | POA: Diagnosis not present

## 2022-06-05 DIAGNOSIS — Z789 Other specified health status: Secondary | ICD-10-CM | POA: Diagnosis not present

## 2022-06-05 DIAGNOSIS — K219 Gastro-esophageal reflux disease without esophagitis: Secondary | ICD-10-CM | POA: Diagnosis not present

## 2022-06-06 ENCOUNTER — Telehealth: Payer: Self-pay

## 2022-06-06 NOTE — Telephone Encounter (Signed)
NOTES SCANNED TO REFERRAL 

## 2022-06-17 ENCOUNTER — Ambulatory Visit: Payer: BC Managed Care – PPO | Admitting: Internal Medicine

## 2022-06-17 VITALS — BP 160/90 | HR 81 | Ht 66.5 in | Wt 193.4 lb

## 2022-06-17 DIAGNOSIS — R079 Chest pain, unspecified: Secondary | ICD-10-CM

## 2022-06-17 NOTE — Patient Instructions (Signed)
Medication Instructions:  Your physician recommends that you continue on your current medications as directed. Please refer to the Current Medication list given to you today.  *If you need a refill on your cardiac medications before your next appointment, please call your pharmacy*  Testing/Procedures: Your physician has requested that you have an echocardiogram. Echocardiography is a painless test that uses sound waves to create images of your heart. It provides your doctor with information about the size and shape of your heart and how well your heart's chambers and valves are working. This procedure takes approximately one hour. There are no restrictions for this procedure.  This will be done at our [facility] location:  [address]  Follow-Up: At [facility], you and your health needs are our priority.  As part of our continuing mission to provide you with exceptional heart care, we have created designated Provider Care Teams.  These Care Teams include your primary Cardiologist (physician) and Advanced Practice Providers (APPs -  Physician Assistants and Nurse Practitioners) who all work together to provide you with the care you need, when you need it.  We recommend signing up for the patient portal called "MyChart".  Sign up information is provided on this After Visit Summary.  MyChart is used to connect with patients for Virtual Visits (Telemedicine).  Patients are able to view lab/test results, encounter notes, upcoming appointments, etc.  Non-urgent messages can be sent to your provider as well.   To learn more about what you can do with MyChart, go to [email or web address].    Your next appointment:   AS NEEDED with Dr. Wyline Mood   Important Information About Sugar

## 2022-06-17 NOTE — Progress Notes (Signed)
Cardiology Office Note:    Date:  06/17/2022   ID:  Micael Hampshire, DOB 06-15-1966, MRN [id number]  PCP:  Fanny Bien, MD   [facility] Providers Cardiologist:  None     Referring MD: Fanny Bien, MD   No chief complaint on file. Chest tightness  History of Present Illness:    Alyssa [name] is a 56 y.o. female with a hx of PE on coumadin, IBS, ?MVP hx. Hx of DVT in 2009. Referral reason noted abnormal EKG. Last EKG seen is from 2012 with NSR and poor R wave progression. Patient described chest tightness. This started a week ago on the L side. It is worsened by by grief. It would last almost 1/2 per day. It is relieved by relaxation. Her husband recently passed away, she is grieving.  Since starting zoloft it is better and symptoms have resolved. Her blood pressures were well controlled until recently. No snoring or apnea.   Past Medical History:  Diagnosis Date   Arthritis    shoulder, hips, hands, feet - tx with aleve   Congenital vesicoureteral reflux    Fibromyalgia    shoulder, hips, hands, feet - tx with aleve   Headache(784.0)    Hyperlipidemia    IBS (irritable bowel syndrome)    treats with diet   MVP (mitral valve prolapse)    dx at age 11yrs old   Peripheral vascular disease ([place]) 08/2008   right leg DVT    Pulmonary embolism, bilateral ([place]) 08/2008   on coumadin x 1 year , no meds, no problems, wears TEDS   Seasonal allergies     Past Surgical History:  Procedure Laterality Date   CHOLECYSTECTOMY  12/03/10   COLONOSCOPY  08/2005   cyst removed     Pilonidal cyst resection   DIAGNOSTIC LAPAROSCOPY     HERNIA REPAIR     Inguinal hernia repair as child   TONSILLECTOMY     UPPER GASTROINTESTINAL ENDOSCOPY     WISDOM TOOTH EXTRACTION      x 4 teeth    Current Medications: Current Meds  Medication Sig   azelastine (ASTELIN) 137 MCG/SPRAY nasal spray Place 1 spray into the nose 2 (two) times daily as needed. Use in each nostril as  directed,for allergies    BIOTIN PO Take 1 tablet by mouth daily.     Calcium Carbonate-Vitamin D (CALCIUM + D PO) Take 1 tablet by mouth 2 (two) times daily. Has 800mg  calcium but unknown vit D strength    Cholecalciferol (VITAMIN D) 2000 UNITS tablet Take 2,000 Units by mouth 2 (two) times daily.     fenofibrate 160 MG tablet Take 160 mg by mouth daily.     fexofenadine (ALLEGRA) 180 MG tablet Take 180 mg by mouth daily.     fluticasone (VERAMYST) 27.5 MCG/SPRAY nasal spray Place 2 sprays into the nose daily.     ibuprofen (ADVIL,MOTRIN) 800 MG tablet Take 1 tablet (800 mg total) by mouth every 8 (eight) hours as needed.   montelukast (SINGULAIR) 10 MG tablet Take 10 mg by mouth at bedtime.     Multiple Vitamins-Minerals (MULTIVITAMINS THER. W/MINERALS) TABS Take 1 tablet by mouth daily. Takes centrum silver    naproxen sodium (ALEVE) 220 MG tablet Take 440 mg by mouth 2 (two) times daily as needed. Prn pain or headache   rosuvastatin (CRESTOR) 10 MG tablet Take 10 mg by mouth daily.     Allergies:   Sumatriptan succinate  Social History   Socioeconomic History   Marital status: Married    Spouse name: Not on file   Number of children: Not on file   Years of education: Not on file   Highest education level: Not on file  Occupational History   Not on file  Tobacco Use   Smoking status: Never   Smokeless tobacco: Never  Substance and Sexual Activity   Alcohol use: Not Currently    Comment: daily   Drug use: No   Sexual activity: Yes    Birth control/protection: None  Other Topics Concern   Not on file  Social History Narrative   Not on file   Social Determinants of Health   Financial Resource Strain: Not on file  Food Insecurity: Not on file  Transportation Needs: Not on file  Physical Activity: Not on file  Stress: Not on file  Social Connections: Not on file     Family History: Stroke. Mother had hx of CHF, afib   ROS:   Please see the history of present  illness.     All other systems reviewed and are negative.  EKGs/Labs/Other Studies Reviewed:    The following studies were reviewed today:   EKG:  EKG is  ordered today.  The ekg ordered today demonstrates   NSR, poor R wave progression  Recent Labs: No results found for requested labs within last 365 days.   Recent Lipid Panel No results found for: "CHOL", "TRIG", "HDL", "CHOLHDL", "VLDL", "LDLCALC", "LDLDIRECT"   Risk Assessment/Calculations:           Physical Exam:    VS:  Vitals:   06/17/22 1617  BP: (!) 160/90  Pulse: 81  SpO2: 96%     Wt Readings from Last 3 Encounters:  06/17/22 193 lb 6.4 oz (87.7 kg)  06/28/21 195 lb (88.5 kg)  07/21/18 185 lb (83.9 kg)     GEN:  Well nourished, well developed in no acute distress HEENT: Normal NECK: No JVD; No carotid bruits CARDIAC: RRR, no murmurs, rubs, gallops RESPIRATORY:  Clear to auscultation without rales, wheezing or rhonchi  ABDOMEN: Soft, non-tender, non-distended MUSCULOSKELETAL:  No edema; No deformity  SKIN: Warm and dry NEUROLOGIC:  Alert and oriented x 3 PSYCHIATRIC:  Normal affect   ASSESSMENT:    #Chest tightness: resolved with zoloft.  Poor r wave progression was seen on prior ecg, could be lead placement. She is low risk for ischemic dx. She has no signs of CHF or EKG changes to suggest Takotsubo. Can plan for TTE with hx of MVP.  #MVP: notes hx of MVP, can get follow up TTE.   #HTN: home ambulatory monitoring. We discussed if remains elevated after this acute period, to consider restarting anti-hypertensives for stage II HTN ( can trial chlorthalidone and lisinopril)  #HLD: continue crestor 10 mg daily. LDL at goal < 100 mg/dL    PLAN:    In order of problems listed above:  TTE Follow up pending results      Medication Adjustments/Labs and Tests Ordered: Current medicines are reviewed at length with the patient today.  Concerns regarding medicines are outlined above.  Orders  Placed This Encounter  Procedures   EKG 12-Lead   ECHOCARDIOGRAM COMPLETE   No orders of the defined types were placed in this encounter.   Patient Instructions  Medication Instructions:  Your physician recommends that you continue on your current medications as directed. Please refer to the Current Medication list given to you today.  *  If you need a refill on your cardiac medications before your next appointment, please call your pharmacy*  Testing/Procedures: Your physician has requested that you have an echocardiogram. Echocardiography is a painless test that uses sound waves to create images of your heart. It provides your doctor with information about the size and shape of your heart and how well your heart's chambers and valves are working. This procedure takes approximately one hour. There are no restrictions for this procedure.  This will be done at our [facility] location:  [address]  Follow-Up: At [facility], you and your health needs are our priority.  As part of our continuing mission to provide you with exceptional heart care, we have created designated Provider Care Teams.  These Care Teams include your primary Cardiologist (physician) and Advanced Practice Providers (APPs -  Physician Assistants and Nurse Practitioners) who all work together to provide you with the care you need, when you need it.  We recommend signing up for the patient portal called "MyChart".  Sign up information is provided on this After Visit Summary.  MyChart is used to connect with patients for Virtual Visits (Telemedicine).  Patients are able to view lab/test results, encounter notes, upcoming appointments, etc.  Non-urgent messages can be sent to your provider as well.   To learn more about what you can do with MyChart, go to [email or web address].    Your next appointment:   AS NEEDED with Dr. Wyline Mood   Important Information About Sugar          Signed, Maisie Fus, MD  06/17/2022 4:53 PM    [facility]

## 2022-07-08 ENCOUNTER — Telehealth (HOSPITAL_COMMUNITY): Payer: Self-pay | Admitting: Internal Medicine

## 2022-07-08 ENCOUNTER — Ambulatory Visit (HOSPITAL_COMMUNITY): Payer: BC Managed Care – PPO

## 2022-07-08 NOTE — Telephone Encounter (Signed)
patient cancelled Echocardiogram due to she is sick and will call us back at a later date to reschedule. Order will be removed from the echo WQ and if patient calls back to reschedule we will reinstate the order. Thank you

## 2022-07-11 ENCOUNTER — Ambulatory Visit
Admission: RE | Admit: 2022-07-11 | Discharge: 2022-07-11 | Disposition: A | Discharge status: Home / Self Care | Payer: BC Managed Care – PPO | Source: Ambulatory Visit | Attending: Family Medicine | Admitting: Family Medicine

## 2022-07-11 ENCOUNTER — Other Ambulatory Visit: Payer: Self-pay | Admitting: Family Medicine

## 2022-07-11 DIAGNOSIS — G4700 Insomnia, unspecified: Secondary | ICD-10-CM | POA: Diagnosis not present

## 2022-07-11 DIAGNOSIS — M25561 Pain in right knee: Secondary | ICD-10-CM | POA: Diagnosis not present

## 2022-07-11 DIAGNOSIS — M170 Bilateral primary osteoarthritis of knee: Secondary | ICD-10-CM

## 2022-07-11 DIAGNOSIS — I10 Essential (primary) hypertension: Secondary | ICD-10-CM | POA: Diagnosis not present

## 2022-07-11 DIAGNOSIS — M25562 Pain in left knee: Secondary | ICD-10-CM | POA: Diagnosis not present

## 2022-07-11 DIAGNOSIS — F411 Generalized anxiety disorder: Secondary | ICD-10-CM | POA: Diagnosis not present

## 2022-07-25 ENCOUNTER — Ambulatory Visit (HOSPITAL_COMMUNITY): Payer: BC Managed Care – PPO | Attending: Cardiology

## 2022-07-25 DIAGNOSIS — R079 Chest pain, unspecified: Secondary | ICD-10-CM | POA: Insufficient documentation

## 2022-07-25 LAB — ECHOCARDIOGRAM COMPLETE
Area-P 1/2: 3.66 cm2
S' Lateral: 3.1 cm

## 2022-09-27 DIAGNOSIS — M170 Bilateral primary osteoarthritis of knee: Secondary | ICD-10-CM | POA: Diagnosis not present

## 2022-09-27 DIAGNOSIS — F411 Generalized anxiety disorder: Secondary | ICD-10-CM | POA: Diagnosis not present

## 2022-09-27 DIAGNOSIS — F331 Major depressive disorder, recurrent, moderate: Secondary | ICD-10-CM | POA: Diagnosis not present

## 2022-09-27 DIAGNOSIS — Z23 Encounter for immunization: Secondary | ICD-10-CM | POA: Diagnosis not present

## 2022-09-27 DIAGNOSIS — G4700 Insomnia, unspecified: Secondary | ICD-10-CM | POA: Diagnosis not present

## 2022-09-27 DIAGNOSIS — I10 Essential (primary) hypertension: Secondary | ICD-10-CM | POA: Diagnosis not present

## 2022-11-12 DIAGNOSIS — G4700 Insomnia, unspecified: Secondary | ICD-10-CM | POA: Diagnosis not present

## 2022-11-12 DIAGNOSIS — F411 Generalized anxiety disorder: Secondary | ICD-10-CM | POA: Diagnosis not present

## 2022-11-12 DIAGNOSIS — Z7289 Other problems related to lifestyle: Secondary | ICD-10-CM | POA: Diagnosis not present

## 2022-11-12 DIAGNOSIS — I10 Essential (primary) hypertension: Secondary | ICD-10-CM | POA: Diagnosis not present

## 2022-11-26 DIAGNOSIS — R42 Dizziness and giddiness: Secondary | ICD-10-CM | POA: Diagnosis not present

## 2022-11-26 DIAGNOSIS — I10 Essential (primary) hypertension: Secondary | ICD-10-CM | POA: Diagnosis not present

## 2022-11-26 DIAGNOSIS — L821 Other seborrheic keratosis: Secondary | ICD-10-CM | POA: Diagnosis not present

## 2023-01-24 DIAGNOSIS — Z1231 Encounter for screening mammogram for malignant neoplasm of breast: Secondary | ICD-10-CM | POA: Diagnosis not present

## 2023-01-24 DIAGNOSIS — Z6833 Body mass index (BMI) 33.0-33.9, adult: Secondary | ICD-10-CM | POA: Diagnosis not present

## 2023-01-24 DIAGNOSIS — Z124 Encounter for screening for malignant neoplasm of cervix: Secondary | ICD-10-CM | POA: Diagnosis not present

## 2023-01-24 DIAGNOSIS — Z01419 Encounter for gynecological examination (general) (routine) without abnormal findings: Secondary | ICD-10-CM | POA: Diagnosis not present

## 2023-01-24 DIAGNOSIS — Z01411 Encounter for gynecological examination (general) (routine) with abnormal findings: Secondary | ICD-10-CM | POA: Diagnosis not present

## 2023-01-27 DIAGNOSIS — I10 Essential (primary) hypertension: Secondary | ICD-10-CM | POA: Diagnosis not present

## 2023-01-27 DIAGNOSIS — Z23 Encounter for immunization: Secondary | ICD-10-CM | POA: Diagnosis not present

## 2023-01-27 DIAGNOSIS — G4700 Insomnia, unspecified: Secondary | ICD-10-CM | POA: Diagnosis not present

## 2023-01-27 DIAGNOSIS — Z114 Encounter for screening for human immunodeficiency virus [HIV]: Secondary | ICD-10-CM | POA: Diagnosis not present

## 2023-01-27 DIAGNOSIS — F331 Major depressive disorder, recurrent, moderate: Secondary | ICD-10-CM | POA: Diagnosis not present

## 2023-01-27 DIAGNOSIS — Z0000 Encounter for general adult medical examination without abnormal findings: Secondary | ICD-10-CM | POA: Diagnosis not present

## 2023-01-27 DIAGNOSIS — F411 Generalized anxiety disorder: Secondary | ICD-10-CM | POA: Diagnosis not present

## 2023-01-27 DIAGNOSIS — Z13220 Encounter for screening for lipoid disorders: Secondary | ICD-10-CM | POA: Diagnosis not present

## 2023-01-27 LAB — LAB REPORT - SCANNED
EGFR: 65.5
HM HIV Screening: NEGATIVE

## 2023-01-29 DIAGNOSIS — H2513 Age-related nuclear cataract, bilateral: Secondary | ICD-10-CM | POA: Diagnosis not present

## 2023-01-29 DIAGNOSIS — H524 Presbyopia: Secondary | ICD-10-CM | POA: Diagnosis not present

## 2023-01-29 DIAGNOSIS — H5203 Hypermetropia, bilateral: Secondary | ICD-10-CM | POA: Diagnosis not present

## 2023-01-29 DIAGNOSIS — H40013 Open angle with borderline findings, low risk, bilateral: Secondary | ICD-10-CM | POA: Diagnosis not present

## 2023-01-29 DIAGNOSIS — H52223 Regular astigmatism, bilateral: Secondary | ICD-10-CM | POA: Diagnosis not present

## 2023-01-30 DIAGNOSIS — Z0000 Encounter for general adult medical examination without abnormal findings: Secondary | ICD-10-CM | POA: Diagnosis not present

## 2023-01-30 DIAGNOSIS — Z1211 Encounter for screening for malignant neoplasm of colon: Secondary | ICD-10-CM | POA: Diagnosis not present

## 2023-01-30 LAB — FECAL OCCULT BLOOD, IMMUNOCHEMICAL: IFOBT: NEGATIVE

## 2023-06-11 DIAGNOSIS — I10 Essential (primary) hypertension: Secondary | ICD-10-CM | POA: Diagnosis not present

## 2023-06-11 DIAGNOSIS — E559 Vitamin D deficiency, unspecified: Secondary | ICD-10-CM | POA: Diagnosis not present

## 2023-06-11 DIAGNOSIS — E782 Mixed hyperlipidemia: Secondary | ICD-10-CM | POA: Diagnosis not present

## 2023-06-11 DIAGNOSIS — E1165 Type 2 diabetes mellitus with hyperglycemia: Secondary | ICD-10-CM | POA: Diagnosis not present

## 2023-06-16 DIAGNOSIS — I10 Essential (primary) hypertension: Secondary | ICD-10-CM | POA: Diagnosis not present

## 2023-06-16 DIAGNOSIS — E782 Mixed hyperlipidemia: Secondary | ICD-10-CM | POA: Diagnosis not present

## 2023-06-16 DIAGNOSIS — E559 Vitamin D deficiency, unspecified: Secondary | ICD-10-CM | POA: Diagnosis not present

## 2023-06-16 DIAGNOSIS — Z789 Other specified health status: Secondary | ICD-10-CM | POA: Diagnosis not present

## 2023-06-20 ENCOUNTER — Other Ambulatory Visit: Payer: Self-pay | Admitting: Family Medicine

## 2023-06-20 ENCOUNTER — Ambulatory Visit
Admission: RE | Admit: 2023-06-20 | Discharge: 2023-06-20 | Disposition: A | Discharge status: Home / Self Care | Payer: BC Managed Care – PPO | Source: Ambulatory Visit | Attending: Family Medicine | Admitting: Family Medicine

## 2023-06-20 DIAGNOSIS — M1711 Unilateral primary osteoarthritis, right knee: Secondary | ICD-10-CM

## 2023-06-20 DIAGNOSIS — M1712 Unilateral primary osteoarthritis, left knee: Secondary | ICD-10-CM

## 2023-06-20 DIAGNOSIS — M25561 Pain in right knee: Secondary | ICD-10-CM | POA: Diagnosis not present

## 2023-06-20 DIAGNOSIS — M170 Bilateral primary osteoarthritis of knee: Secondary | ICD-10-CM | POA: Diagnosis not present

## 2023-06-20 DIAGNOSIS — G8929 Other chronic pain: Secondary | ICD-10-CM | POA: Diagnosis not present

## 2023-06-20 DIAGNOSIS — M25562 Pain in left knee: Secondary | ICD-10-CM | POA: Diagnosis not present

## 2023-06-26 DIAGNOSIS — E669 Obesity, unspecified: Secondary | ICD-10-CM | POA: Diagnosis not present

## 2023-06-26 DIAGNOSIS — I10 Essential (primary) hypertension: Secondary | ICD-10-CM | POA: Diagnosis not present

## 2023-06-26 DIAGNOSIS — M170 Bilateral primary osteoarthritis of knee: Secondary | ICD-10-CM | POA: Diagnosis not present

## 2023-06-27 ENCOUNTER — Encounter: Payer: Self-pay | Admitting: Internal Medicine

## 2023-11-10 ENCOUNTER — Ambulatory Visit (INDEPENDENT_AMBULATORY_CARE_PROVIDER_SITE_OTHER): Payer: BC Managed Care – PPO | Admitting: Internal Medicine

## 2023-11-10 ENCOUNTER — Telehealth: Payer: Self-pay | Admitting: Internal Medicine

## 2023-11-10 ENCOUNTER — Encounter: Payer: Self-pay | Admitting: Internal Medicine

## 2023-11-10 ENCOUNTER — Other Ambulatory Visit: Payer: BC Managed Care – PPO

## 2023-11-10 VITALS — BP 130/82 | HR 80 | Ht 66.0 in | Wt 203.0 lb

## 2023-11-10 DIAGNOSIS — Z860100 Personal history of colon polyps, unspecified: Secondary | ICD-10-CM

## 2023-11-10 DIAGNOSIS — K580 Irritable bowel syndrome with diarrhea: Secondary | ICD-10-CM | POA: Diagnosis not present

## 2023-11-10 DIAGNOSIS — R109 Unspecified abdominal pain: Secondary | ICD-10-CM | POA: Diagnosis not present

## 2023-11-10 DIAGNOSIS — R140 Abdominal distension (gaseous): Secondary | ICD-10-CM

## 2023-11-10 MED ORDER — HYOSCYAMINE SULFATE 0.125 MG SL SUBL: SUBLINGUAL_TABLET | SUBLINGUAL | 3 refills | Status: AC

## 2023-11-10 MED ORDER — NA SULFATE-K SULFATE-MG SULF 17.5-3.13-1.6 GM/177ML PO SOLN: 1.0000 | Freq: Once | ORAL | 0 refills | Status: AC

## 2023-11-10 NOTE — Progress Notes (Signed)
HPI: Discussed the use of AI scribe software for clinical note transcription with the patient, who gave verbal consent to proceed.  History of Present Illness   Alyssa [name] is a 57 year old female with PMH of IBS with loose stools, history of nonadvanced adenoma of the colon (2017), remote DVT/PE who is here to establish care. She is here alone today.   She reports history of spastic colon and can have increased frequency after eating.  Bowel movements are usually formed but can be loose and occur 5 or 6 times a day.   The patient also reports occasional rectal pain, particularly after bowel movements, which is sometimes associated with minor bleeding. She has noticed an increase in abdominal cramping, which is relieved by bowel movements.  The patient has a family history of Crohn's disease in her sister and diverticulitis in her mother. She also reports a history of a small adenoma found on a colonoscopy approximately five years ago.  The patient also reports occasional heartburn, which has improved since her gallbladder was removed.  The patient has experienced significant personal loss recently, with the passing of her husband, father, and mother within the past year and a half. Her husband had a history of Barrett's esophagus, precancerous colon polyps, prostate cancer, bladder cancer, and urethral cancer, which eventually metastasized to the bone.   The patient has been trying to maintain her health amidst these personal losses, and is seeking to address her gastrointestinal symptoms. She has tried probiotics in the past, but is unsure if they provided any benefit. The patient also reports that she feels better and has less frequent bowel movements when she cuts out starches from her diet.      Past Medical History:  Diagnosis Date   Arthritis    shoulder, hips, hands, feet - tx with aleve   Congenital vesicoureteral reflux    Fibromyalgia    shoulder, hips, hands, feet - tx with  aleve   Headache(784.0)    Hyperlipidemia    IBS (irritable bowel syndrome)    treats with diet   MVP (mitral valve prolapse)    dx at age 80yrs old   Peripheral vascular disease ([facility]) 08/2008   right leg DVT    Pulmonary embolism, bilateral ([facility]) 08/2008   on coumadin x 1 year , no meds, no problems, wears TEDS   Seasonal allergies     Past Surgical History:  Procedure Laterality Date   CHOLECYSTECTOMY  12/03/10   COLONOSCOPY  08/2005   cyst removed     Pilonidal cyst resection   DIAGNOSTIC LAPAROSCOPY     HERNIA REPAIR     Inguinal hernia repair as child   TONSILLECTOMY     UPPER GASTROINTESTINAL ENDOSCOPY     WISDOM TOOTH EXTRACTION      x 4 teeth    Outpatient Medications Prior to Visit  Medication Sig Dispense Refill   azelastine (ASTELIN) 137 MCG/SPRAY nasal spray Place 1 spray into the nose 2 (two) times daily as needed. Use in each nostril as directed,for allergies      BIOTIN PO Take 1 tablet by mouth daily.       Calcium Carbonate-Vitamin D (CALCIUM + D PO) Take 1 tablet by mouth 2 (two) times daily. Has 800mg  calcium but unknown vit D strength      Cholecalciferol (VITAMIN D) 2000 UNITS tablet Take 2,000 Units by mouth 2 (two) times daily.       diclofenac Sodium (VOLTAREN) 1 % GEL Apply  to lower extremities, 4 gm of gel to affected area 4 times daily.     fenofibrate 160 MG tablet Take 160 mg by mouth daily.       fexofenadine (ALLEGRA) 180 MG tablet Take 180 mg by mouth daily.       fluticasone (VERAMYST) 27.5 MCG/SPRAY nasal spray Place 2 sprays into the nose daily.       ibuprofen (ADVIL,MOTRIN) 800 MG tablet Take 1 tablet (800 mg total) by mouth every 8 (eight) hours as needed. 21 tablet 0   montelukast (SINGULAIR) 10 MG tablet Take 10 mg by mouth at bedtime.       Multiple Vitamins-Minerals (MULTIVITAMINS THER. W/MINERALS) TABS Take 1 tablet by mouth daily. Takes centrum silver      naproxen sodium (ALEVE) 220 MG tablet Take 440 mg by mouth 2 (two) times daily  as needed. Prn pain or headache     rosuvastatin (CRESTOR) 10 MG tablet Take 10 mg by mouth daily.     valsartan (DIOVAN) 40 MG tablet Take 40 mg by mouth daily.     omeprazole (PRILOSEC) 40 MG capsule Take 40 mg by mouth daily.     sertraline (ZOLOFT) 25 MG tablet Take 25 mg by mouth every morning.     No facility-administered medications prior to visit.    Allergies  Allergen Reactions   Sumatriptan Succinate Swelling    Throat swelling    Family History  Problem Relation Age of Onset   Congestive Heart Failure Mother    Atrial fibrillation Mother    Liver cancer Mother    Esophageal cancer Neg Hx    Colon cancer Neg Hx     Social History   Tobacco Use   Smoking status: Never   Smokeless tobacco: Never  Vaping Use   Vaping status: Never Used  Substance Use Topics   Alcohol use: Not Currently    Comment: daily   Drug use: No    ROS: As per history of present illness, otherwise negative  BP 130/82   Pulse 80   Ht 5\' 6"  (1.676 m)   Wt 203 lb (92.1 kg)   BMI 32.77 kg/m  Gen: awake, alert, NAD HEENT: anicteric  CV: RRR, no mrg Pulm: CTA b/l Abd: soft, NT/ND, +BS throughout Ext: no c/c/e Neuro: nonfocal   RELEVANT LABS AND IMAGING: CBC    Component Value Date/Time   WBC 5.5 12/20/2011 0909   RBC 4.49 12/20/2011 0909   HGB 13.4 12/20/2011 0909   HCT 38.8 12/20/2011 0909   PLT 267 12/20/2011 0909   MCV 86.4 12/20/2011 0909   MCH 29.8 12/20/2011 0909   MCHC 34.5 12/20/2011 0909   RDW 12.8 12/20/2011 0909   LYMPHSABS 2.0 10/13/2008 1220   MONOABS 0.6 10/13/2008 1220   EOSABS 0.1 10/13/2008 1220   BASOSABS 0.0 10/13/2008 1220    CMP     Component Value Date/Time   NA 140 11/28/2010 0834   K 4.4 11/28/2010 0834   CL 104 11/28/2010 0834   CO2 30 11/28/2010 0834   GLUCOSE 102 (H) 11/28/2010 0834   BUN 8 11/28/2010 0834   CREATININE 0.84 11/28/2010 0834   CALCIUM 9.4 11/28/2010 0834   PROT 6.5 09/01/2008 1415   ALBUMIN 3.6 09/01/2008 1415    AST 21 09/01/2008 1415   ALT 24 09/01/2008 1415   ALKPHOS 48 09/01/2008 1415   BILITOT 0.7 09/01/2008 1415   GFRNONAA >60 11/28/2010 0834   GFRAA  11/28/2010 0834    >60  The eGFR has been calculated using the MDRD equation. This calculation has not been validated in all clinical situations. eGFR's persistently <60 mL/min signify possible Chronic Kidney Disease.    Results   DIAGNOSTIC Colonoscopy: Small adenoma, precancerous type polyp (Late 2022) DVT: Deep vein thrombosis in the leg (11 years ago) Pulmonary embolism: Bilateral pulmonary embolism (11 years ago)      ASSESSMENT/PLAN: Assessment and Plan    Irritable Bowel Syndrome (IBS) Frequent bowel movements, sometimes immediately after eating, with occasional cramping. No constipation, but reports migraines when constipated. No current medication for IBS. -Prescribe Levsin (Hyoscyamine) 0.125 mg, 1-2 sublingual tablets every 4-6 hours to be taken as needed for cramping. -Plan colonoscopy to rule out other causes of symptoms. -Consider trial of Rifaximin post-colonoscopy if IBS is confirmed and symptoms persist. -- Check celiac panel  Family History of Crohn's Disease and Diverticulitis Patient's sister has Crohn's disease and mother had diverticulitis. Patient has frequent bowel movements and occasional cramping, but no other symptoms suggestive of these conditions. -Plan colonoscopy to rule out these conditions given family history.  History of Adenoma Small adenoma found on colonoscopy in 2017 with a 5-year recall. -Plan colonoscopy to monitor for surveillance. -- Schedule today in the [facility]   GE:[id number], Christell Constant, Md [address] [place],  [place] [address]

## 2023-11-10 NOTE — Telephone Encounter (Signed)
Good morning PT is scheduled for 830 and said she would be a few min late I advised her of the late policy

## 2023-11-10 NOTE — Patient Instructions (Signed)
Your provider has requested that you go to the basement level for lab work before leaving today. Press "B" on the elevator. The lab is located at the first door on the left as you exit the elevator.  We have sent the following medications to your pharmacy for you to pick up at your convenience: Levsin and Suprep.   You have been scheduled for a colonoscopy. Please follow written instructions given to you at your visit today.   Please pick up your prep supplies at the pharmacy within the next 1-3 days.  If you use inhalers (even only as needed), please bring them with you on the day of your procedure.  DO NOT TAKE 7 DAYS PRIOR TO TEST- Trulicity (dulaglutide) Ozempic, Wegovy (semaglutide) Mounjaro (tirzepatide) Bydureon Bcise (exanatide extended release)  DO NOT TAKE 1 DAY PRIOR TO YOUR TEST Rybelsus (semaglutide) Adlyxin (lixisenatide) Victoza (liraglutide) Byetta (exanatide)  If your blood pressure at your visit was 140/90 or greater, please contact your primary care physician to follow up on this.  _______________________________________________________  If you are age 93 or older, your body mass index should be between 23-30. Your Body mass index is 32.77 kg/m. If this is out of the aforementioned range listed, please consider follow up with your Primary Care Provider.  If you are age 61 or younger, your body mass index should be between 19-25. Your Body mass index is 32.77 kg/m. If this is out of the aformentioned range listed, please consider follow up with your Primary Care Provider.   ________________________________________________________  The [place] [facility] providers would like to encourage you to use [facility] to communicate with providers for non-urgent requests or questions.  Due to long hold times on the telephone, sending your provider a message by [facility] may be a faster and more efficient way to get a response.  Please allow 48 business hours for a response.  Please  remember that this is for non-urgent requests.  _______________________________________________________

## 2023-11-10 NOTE — Telephone Encounter (Signed)
Patient is here in the office to be seen.

## 2023-11-11 LAB — IGA: Immunoglobulin A: 105 mg/dL (ref 47–310)

## 2023-11-11 LAB — TISSUE TRANSGLUTAMINASE, IGA: (tTG) Ab, IgA: <1 U/mL

## 2023-12-25 DIAGNOSIS — G43109 Migraine with aura, not intractable, without status migrainosus: Secondary | ICD-10-CM | POA: Diagnosis not present

## 2023-12-25 DIAGNOSIS — R519 Headache, unspecified: Secondary | ICD-10-CM | POA: Diagnosis not present

## 2023-12-25 DIAGNOSIS — I10 Essential (primary) hypertension: Secondary | ICD-10-CM | POA: Diagnosis not present

## 2023-12-25 DIAGNOSIS — R42 Dizziness and giddiness: Secondary | ICD-10-CM | POA: Diagnosis not present

## 2024-01-08 DIAGNOSIS — G4700 Insomnia, unspecified: Secondary | ICD-10-CM | POA: Diagnosis not present

## 2024-01-08 DIAGNOSIS — I10 Essential (primary) hypertension: Secondary | ICD-10-CM | POA: Diagnosis not present

## 2024-01-08 DIAGNOSIS — F411 Generalized anxiety disorder: Secondary | ICD-10-CM | POA: Diagnosis not present

## 2024-01-08 DIAGNOSIS — F331 Major depressive disorder, recurrent, moderate: Secondary | ICD-10-CM | POA: Diagnosis not present

## 2024-01-20 ENCOUNTER — Encounter: Payer: Self-pay | Admitting: Internal Medicine

## 2024-01-26 DIAGNOSIS — Z0000 Encounter for general adult medical examination without abnormal findings: Secondary | ICD-10-CM | POA: Diagnosis not present

## 2024-01-26 DIAGNOSIS — Z13220 Encounter for screening for lipoid disorders: Secondary | ICD-10-CM | POA: Diagnosis not present

## 2024-01-27 DIAGNOSIS — R87610 Atypical squamous cells of undetermined significance on cytologic smear of cervix (ASC-US): Secondary | ICD-10-CM | POA: Diagnosis not present

## 2024-01-27 DIAGNOSIS — Z01419 Encounter for gynecological examination (general) (routine) without abnormal findings: Secondary | ICD-10-CM | POA: Diagnosis not present

## 2024-01-27 DIAGNOSIS — Z1231 Encounter for screening mammogram for malignant neoplasm of breast: Secondary | ICD-10-CM | POA: Diagnosis not present

## 2024-01-27 DIAGNOSIS — Z1331 Encounter for screening for depression: Secondary | ICD-10-CM | POA: Diagnosis not present

## 2024-01-28 ENCOUNTER — Encounter: Payer: Self-pay | Admitting: Internal Medicine

## 2024-01-28 ENCOUNTER — Ambulatory Visit: Payer: BC Managed Care – PPO | Admitting: Internal Medicine

## 2024-01-28 VITALS — BP 115/63 | HR 85 | Temp 97.2°F | Resp 12 | Ht 66.0 in | Wt 203.0 lb

## 2024-01-28 DIAGNOSIS — K5730 Diverticulosis of large intestine without perforation or abscess without bleeding: Secondary | ICD-10-CM

## 2024-01-28 DIAGNOSIS — Z1211 Encounter for screening for malignant neoplasm of colon: Secondary | ICD-10-CM | POA: Diagnosis not present

## 2024-01-28 DIAGNOSIS — Z860101 Personal history of adenomatous and serrated colon polyps: Secondary | ICD-10-CM | POA: Diagnosis not present

## 2024-01-28 DIAGNOSIS — Z860100 Personal history of colon polyps, unspecified: Secondary | ICD-10-CM

## 2024-01-28 DIAGNOSIS — D123 Benign neoplasm of transverse colon: Secondary | ICD-10-CM

## 2024-01-28 MED ORDER — RIFAXIMIN 550 MG PO TABS: 550.0000 mg | ORAL_TABLET | Freq: Three times a day (TID) | ORAL | 0 refills | Status: AC

## 2024-01-28 MED ORDER — SODIUM CHLORIDE 0.9 % IV SOLN: 500.0000 mL | Freq: Once | INTRAVENOUS | Status: DC

## 2024-01-28 NOTE — Progress Notes (Signed)
Pt A/O x 3, gd SR's, pleased with anesthesia, report to RN

## 2024-01-28 NOTE — Patient Instructions (Signed)
Educational handout provided to patient related to Polyps and Diverticulosis  Resume previous diet  Continue present medications- RIFAXIMIN 550MG  TID X 14 DAYS FOR IBS-D  Awaiting pathology results   YOU HAD AN ENDOSCOPIC PROCEDURE TODAY AT THE [facility]:   Refer to the procedure report that was given to you for any specific questions about what was found during the examination.  If the procedure report does not answer your questions, please call your gastroenterologist to clarify.  If you requested that your care partner not be given the details of your procedure findings, then the procedure report has been included in a sealed envelope for you to review at your convenience later.  YOU SHOULD EXPECT: Some feelings of bloating in the abdomen. Passage of more gas than usual.  Walking can help get rid of the air that was put into your GI tract during the procedure and reduce the bloating. If you had a lower endoscopy (such as a colonoscopy or flexible sigmoidoscopy) you may notice spotting of blood in your stool or on the toilet paper. If you underwent a bowel prep for your procedure, you may not have a normal bowel movement for a few days.  Please Note:  You might notice some irritation and congestion in your nose or some drainage.  This is from the oxygen used during your procedure.  There is no need for concern and it should clear up in a day or so.  SYMPTOMS TO REPORT IMMEDIATELY:  Following lower endoscopy (colonoscopy or flexible sigmoidoscopy):  Excessive amounts of blood in the stool  Significant tenderness or worsening of abdominal pains  Swelling of the abdomen that is new, acute  Fever of 100F or higher  For urgent or emergent issues, a gastroenterologist can be reached at any hour by calling [phone]. Do not use MyChart messaging for urgent concerns.    DIET:  We do recommend a small meal at first, but then you may proceed to your regular diet.  Drink plenty  of fluids but you should avoid alcoholic beverages for 24 hours.  ACTIVITY:  You should plan to take it easy for the rest of today and you should NOT DRIVE or use heavy machinery until tomorrow (because of the sedation medicines used during the test).    FOLLOW UP: Our staff will call the number listed on your records the next business day following your procedure.  We will call around 7:15- 8:00 am to check on you and address any questions or concerns that you may have regarding the information given to you following your procedure. If we do not reach you, we will leave a message.     If any biopsies were taken you will be contacted by phone or by letter within the next 1-3 weeks.  Please call us at [phone] if you have not heard about the biopsies in 3 weeks.    SIGNATURES/CONFIDENTIALITY: You and/or your care partner have signed paperwork which will be entered into your electronic medical record.  These signatures attest to the fact that that the information above on your After Visit Summary has been reviewed and is understood.  Full responsibility of the confidentiality of this discharge information lies with you and/or your care-partner.

## 2024-01-28 NOTE — Progress Notes (Signed)
GASTROENTEROLOGY PROCEDURE H&P NOTE   Primary Care Physician: Lewis Moccasin, MD    Reason for Procedure:   Hx of polyps  Plan:    colonoscopy  Patient is appropriate for endoscopic procedure(s) in the ambulatory ([facility]) setting.  The nature of the procedure, as well as the risks, benefits, and alternatives were carefully and thoroughly reviewed with the patient. Ample time for discussion and questions allowed. The patient understood, was satisfied, and agreed to proceed.     HPI: Alyssa [name] is a 58 y.o. female who presents for colonoscopy.  Medical history as below.  Tolerated the prep.  No recent chest pain or shortness of breath.  No abdominal pain today.  Past Medical History:  Diagnosis Date   Arthritis    shoulder, hips, hands, feet - tx with aleve   Congenital vesicoureteral reflux    Fibromyalgia    shoulder, hips, hands, feet - tx with aleve   Headache(784.0)    Hyperlipidemia    IBS (irritable bowel syndrome)    treats with diet   MVP (mitral valve prolapse)    dx at age 21yrs old   Peripheral vascular disease (HCC) 08/2008   right leg DVT    Pulmonary embolism, bilateral (HCC) 08/2008   on coumadin x 1 year , no meds, no problems, wears TEDS   Seasonal allergies     Past Surgical History:  Procedure Laterality Date   CHOLECYSTECTOMY  12/03/10   COLONOSCOPY  08/2005   cyst removed     Pilonidal cyst resection   DIAGNOSTIC LAPAROSCOPY     HERNIA REPAIR     Inguinal hernia repair as child   TONSILLECTOMY     UPPER GASTROINTESTINAL ENDOSCOPY     WISDOM TOOTH EXTRACTION      x 4 teeth    Prior to Admission medications   Medication Sig Start Date End Date Taking? Authorizing Provider  Calcium Carbonate-Vitamin D (CALCIUM + D PO) Take 1 tablet by mouth 2 (two) times daily. Has 800mg  calcium but unknown vit D strength    Yes [provider]  Cholecalciferol (VITAMIN D) 2000 UNITS tablet Take 2,000 Units by mouth 2 (two) times daily.      Yes [provider]  fenofibrate 160 MG tablet Take 160 mg by mouth daily.     Yes [provider]  fexofenadine (ALLEGRA) 180 MG tablet Take 180 mg by mouth daily.     Yes [provider]  hyoscyamine (LEVSIN/SL) 0.125 MG SL tablet Take 1-2 tablets by mouth under tongue every 4-6 hours as needed for abdominal cramping/pain 11/10/23  Yes Kadarious Dikes, Carie Caddy, MD  montelukast (SINGULAIR) 10 MG tablet Take 10 mg by mouth at bedtime.     Yes [provider]  naproxen sodium (ALEVE) 220 MG tablet Take 440 mg by mouth 2 (two) times daily as needed. Prn pain or headache   Yes [provider]  rosuvastatin (CRESTOR) 10 MG tablet Take 10 mg by mouth daily.   Yes [provider]  sertraline (ZOLOFT) 25 MG tablet Take 25 mg by mouth every morning. 12/08/23  Yes [provider]  valsartan (DIOVAN) 40 MG tablet Take 40 mg by mouth daily. 09/16/23  Yes [provider]  azelastine (ASTELIN) 137 MCG/SPRAY nasal spray Place 1 spray into the nose 2 (two) times daily as needed. Use in each nostril as directed,for allergies     [provider]  BIOTIN PO Take 1 tablet by mouth daily.  Patient not taking: Reported on 01/28/2024    [provider]  diclofenac Sodium (VOLTAREN) 1 % GEL Apply to lower extremities, 4 gm of gel to affected area 4 times daily. 07/20/12   [provider]  fluticasone (VERAMYST) 27.5 MCG/SPRAY nasal spray Place 2 sprays into the nose daily.      [provider]  ibuprofen (ADVIL,MOTRIN) 800 MG tablet Take 1 tablet (800 mg total) by mouth every 8 (eight) hours as needed. 07/21/18   Long, Arlyss Repress, MD  Multiple Vitamins-Minerals (MULTIVITAMINS THER. W/MINERALS) TABS Take 1 tablet by mouth daily. Takes centrum silver     [provider]    Current Outpatient Medications  Medication Sig Dispense Refill   Calcium Carbonate-Vitamin D (CALCIUM + D PO) Take 1 tablet by mouth 2 (two) times  daily. Has 800mg  calcium but unknown vit D strength      Cholecalciferol (VITAMIN D) 2000 UNITS tablet Take 2,000 Units by mouth 2 (two) times daily.       fenofibrate 160 MG tablet Take 160 mg by mouth daily.       fexofenadine (ALLEGRA) 180 MG tablet Take 180 mg by mouth daily.       hyoscyamine (LEVSIN/SL) 0.125 MG SL tablet Take 1-2 tablets by mouth under tongue every 4-6 hours as needed for abdominal cramping/pain 100 tablet 3   montelukast (SINGULAIR) 10 MG tablet Take 10 mg by mouth at bedtime.       naproxen sodium (ALEVE) 220 MG tablet Take 440 mg by mouth 2 (two) times daily as needed. Prn pain or headache     rosuvastatin (CRESTOR) 10 MG tablet Take 10 mg by mouth daily.     sertraline (ZOLOFT) 25 MG tablet Take 25 mg by mouth every morning.     valsartan (DIOVAN) 40 MG tablet Take 40 mg by mouth daily.     azelastine (ASTELIN) 137 MCG/SPRAY nasal spray Place 1 spray into the nose 2 (two) times daily as needed. Use in each nostril as directed,for allergies      BIOTIN PO Take 1 tablet by mouth daily.   (Patient not taking: Reported on 01/28/2024)     diclofenac Sodium (VOLTAREN) 1 % GEL Apply to lower extremities, 4 gm of gel to affected area 4 times daily.     fluticasone (VERAMYST) 27.5 MCG/SPRAY nasal spray Place 2 sprays into the nose daily.       ibuprofen (ADVIL,MOTRIN) 800 MG tablet Take 1 tablet (800 mg total) by mouth every 8 (eight) hours as needed. 21 tablet 0   Multiple Vitamins-Minerals (MULTIVITAMINS THER. W/MINERALS) TABS Take 1 tablet by mouth daily. Takes centrum silver      Current Facility-Administered Medications  Medication Dose Route Frequency Provider Last Rate Last Admin   0.9 %  sodium chloride infusion  500 mL Intravenous Once Tabb Croghan, Carie Caddy, MD        Allergies as of 01/28/2024 - Review Complete 01/28/2024  Allergen Reaction Noted   Sumatriptan succinate Swelling 12/11/2011    Family History  Problem Relation Age of Onset   Congestive Heart Failure  Mother    Atrial fibrillation Mother    Liver cancer Mother    Diverticulosis Mother    Crohn's disease Sister    Esophageal cancer Neg Hx    Colon cancer Neg Hx    Rectal cancer Neg Hx    Stomach cancer Neg Hx     Social History   Socioeconomic History   Marital status: Widowed  Spouse name: Not on file   Number of children: 3   Years of education: Not on file   Highest education level: Not on file  Occupational History   Occupation: senior finiacial  Tobacco Use   Smoking status: Never   Smokeless tobacco: Never  Vaping Use   Vaping status: Never Used  Substance and Sexual Activity   Alcohol use: Not Currently    Comment: daily   Drug use: No   Sexual activity: Yes    Birth control/protection: None  Other Topics Concern   Not on file  Social History Narrative   Not on file   Social Drivers of Health   Financial Resource Strain: Not on file  Food Insecurity: Not on file  Transportation Needs: Not on file  Physical Activity: Not on file  Stress: Not on file  Social Connections: Not on file  Intimate Partner Violence: Not on file    Physical Exam: Vital signs in last 24 hours: @BP  132/66   Pulse 75   Temp (!) 97.2 F (36.2 C) (Temporal)   Ht 5\' 6"  (1.676 m)   Wt 203 lb (92.1 kg)   SpO2 97%   BMI 32.77 kg/m  GEN: NAD EYE: Sclerae anicteric ENT: MMM CV: Non-tachycardic Pulm: CTA b/l GI: Soft, NT/ND NEURO:  Alert & Oriented x 3   Erick Blinks, MD [facility]  01/28/2024 12:12 PM

## 2024-01-28 NOTE — Op Note (Signed)
[facility] Patient Name: Alyssa [name] Procedure Date: 01/28/2024 12:14 PM MRN: [id number] Endoscopist: Beverley Fiedler , MD, [id number] Age: 58 Referring MD:  Date of Birth: 01/12/1966 Gender: Female Account #: [id number] Procedure:                Colonoscopy Indications:              High risk colon cancer surveillance: Personal                            history of non-advanced adenoma Medicines:                Monitored Anesthesia Care Procedure:                Pre-Anesthesia Assessment:                           - Prior to the procedure, a History and Physical                            was performed, and patient medications and                            allergies were reviewed. The patient's tolerance of                            previous anesthesia was also reviewed. The risks                            and benefits of the procedure and the sedation                            options and risks were discussed with the patient.                            All questions were answered, and informed consent                            was obtained. Prior Anticoagulants: The patient has                            taken no anticoagulant or antiplatelet agents. ASA                            Grade Assessment: II - A patient with mild systemic                            disease. After reviewing the risks and benefits,                            the patient was deemed in satisfactory condition to                            undergo the procedure.  After obtaining informed consent, the colonoscope                            was passed under direct vision. Throughout the                            procedure, the patient's blood pressure, pulse, and                            oxygen saturations were monitored continuously. The                            Olympus Scope [id number] was introduced through the                            anus and advanced to the cecum,  identified by                            appendiceal orifice and ileocecal valve. The                            colonoscopy was performed without difficulty. The                            patient tolerated the procedure well. The quality                            of the bowel preparation was good. The ileocecal                            valve, appendiceal orifice, and rectum were                            photographed. Scope In: 12:21:02 PM Scope Out: 12:30:44 PM Scope Withdrawal Time: 0 hours 8 minutes 6 seconds  Total Procedure Duration: 0 hours 9 minutes 42 seconds  Findings:                 The digital rectal exam was normal.                           A 5 mm polyp was found in the transverse colon. The                            polyp was sessile. The polyp was removed with a                            cold snare. Resection and retrieval were complete.                           Multiple medium-mouthed and small-mouthed                            diverticula were found in the sigmoid colon,  transverse colon, hepatic flexure and ascending                            colon.                           The exam was otherwise without abnormality on                            direct and retroflexion views. Complications:            No immediate complications. Estimated Blood Loss:     Estimated blood loss: none. Impression:               - One 5 mm polyp in the transverse colon, removed                            with a cold snare. Resected and retrieved.                           - Mild diverticulosis in the sigmoid colon, in the                            transverse colon, at the hepatic flexure and in the                            ascending colon.                           - The examination was otherwise normal on direct                            and retroflexion views. Recommendation:           - Patient has a contact number available for                             emergencies. The signs and symptoms of potential                            delayed complications were discussed with the                            patient. Return to normal activities tomorrow.                            Written discharge instructions were provided to the                            patient.                           - Resume previous diet.                           - Continue present medications.                           -  Await pathology results.                           - Rifaximin 550 mg TID x 14 days for IBS-D.                           - Repeat colonoscopy is recommended for                            surveillance. The colonoscopy date will be                            determined after pathology results from today's                            exam become available for review. Beverley Fiedler, MD 01/28/2024 12:33:48 PM This report has been signed electronically.

## 2024-01-28 NOTE — Progress Notes (Signed)
Called to room to assist during endoscopic procedure.  Patient ID and intended procedure confirmed with present staff. Received instructions for my participation in the procedure from the performing physician.

## 2024-01-29 ENCOUNTER — Telehealth: Payer: Self-pay

## 2024-01-29 NOTE — Telephone Encounter (Signed)
No answer, left message to call if having any issues or concerns, B.Valincia Touch RN

## 2024-01-30 LAB — SURGICAL PATHOLOGY

## 2024-02-02 DIAGNOSIS — Z0000 Encounter for general adult medical examination without abnormal findings: Secondary | ICD-10-CM | POA: Diagnosis not present

## 2024-02-04 ENCOUNTER — Encounter: Payer: Self-pay | Admitting: Internal Medicine

## 2024-02-10 DIAGNOSIS — H40013 Open angle with borderline findings, low risk, bilateral: Secondary | ICD-10-CM | POA: Diagnosis not present

## 2024-02-10 DIAGNOSIS — H2513 Age-related nuclear cataract, bilateral: Secondary | ICD-10-CM | POA: Diagnosis not present

## 2024-05-05 DIAGNOSIS — E559 Vitamin D deficiency, unspecified: Secondary | ICD-10-CM | POA: Diagnosis not present

## 2024-05-05 DIAGNOSIS — R5383 Other fatigue: Secondary | ICD-10-CM | POA: Diagnosis not present

## 2024-05-05 DIAGNOSIS — R739 Hyperglycemia, unspecified: Secondary | ICD-10-CM | POA: Diagnosis not present

## 2024-05-10 DIAGNOSIS — I10 Essential (primary) hypertension: Secondary | ICD-10-CM | POA: Diagnosis not present

## 2024-05-10 DIAGNOSIS — R7303 Prediabetes: Secondary | ICD-10-CM | POA: Diagnosis not present

## 2024-05-10 DIAGNOSIS — E559 Vitamin D deficiency, unspecified: Secondary | ICD-10-CM | POA: Diagnosis not present

## 2024-05-10 DIAGNOSIS — J309 Allergic rhinitis, unspecified: Secondary | ICD-10-CM | POA: Diagnosis not present

## 2024-06-11 DIAGNOSIS — E782 Mixed hyperlipidemia: Secondary | ICD-10-CM | POA: Diagnosis not present

## 2024-06-11 DIAGNOSIS — I10 Essential (primary) hypertension: Secondary | ICD-10-CM | POA: Diagnosis not present

## 2024-06-11 DIAGNOSIS — M170 Bilateral primary osteoarthritis of knee: Secondary | ICD-10-CM | POA: Diagnosis not present

## 2024-07-22 DIAGNOSIS — F411 Generalized anxiety disorder: Secondary | ICD-10-CM | POA: Diagnosis not present

## 2024-07-22 DIAGNOSIS — L749 Eccrine sweat disorder, unspecified: Secondary | ICD-10-CM | POA: Diagnosis not present

## 2024-08-05 DIAGNOSIS — R739 Hyperglycemia, unspecified: Secondary | ICD-10-CM | POA: Diagnosis not present

## 2024-08-05 DIAGNOSIS — E785 Hyperlipidemia, unspecified: Secondary | ICD-10-CM | POA: Diagnosis not present

## 2024-08-12 DIAGNOSIS — Z789 Other specified health status: Secondary | ICD-10-CM | POA: Diagnosis not present

## 2024-08-12 DIAGNOSIS — E782 Mixed hyperlipidemia: Secondary | ICD-10-CM | POA: Diagnosis not present

## 2024-08-12 DIAGNOSIS — Z23 Encounter for immunization: Secondary | ICD-10-CM | POA: Diagnosis not present

## 2024-08-12 DIAGNOSIS — I10 Essential (primary) hypertension: Secondary | ICD-10-CM | POA: Diagnosis not present

## 2024-08-12 DIAGNOSIS — R7303 Prediabetes: Secondary | ICD-10-CM | POA: Diagnosis not present

## 2024-10-11 DIAGNOSIS — I10 Essential (primary) hypertension: Secondary | ICD-10-CM | POA: Diagnosis not present

## 2024-10-11 DIAGNOSIS — E559 Vitamin D deficiency, unspecified: Secondary | ICD-10-CM | POA: Diagnosis not present

## 2024-10-11 DIAGNOSIS — E782 Mixed hyperlipidemia: Secondary | ICD-10-CM | POA: Diagnosis not present

## 2024-10-14 DIAGNOSIS — Z23 Encounter for immunization: Secondary | ICD-10-CM | POA: Diagnosis not present

## 2024-10-14 DIAGNOSIS — E782 Mixed hyperlipidemia: Secondary | ICD-10-CM | POA: Diagnosis not present

## 2024-10-14 DIAGNOSIS — Z789 Other specified health status: Secondary | ICD-10-CM | POA: Diagnosis not present

## 2024-10-14 DIAGNOSIS — Z7185 Encounter for immunization safety counseling: Secondary | ICD-10-CM | POA: Diagnosis not present

## 2024-10-14 DIAGNOSIS — I10 Essential (primary) hypertension: Secondary | ICD-10-CM | POA: Diagnosis not present

## 2024-10-14 DIAGNOSIS — E559 Vitamin D deficiency, unspecified: Secondary | ICD-10-CM | POA: Diagnosis not present

## 2024-11-11 DIAGNOSIS — H5319 Other subjective visual disturbances: Secondary | ICD-10-CM | POA: Diagnosis not present

## 2024-11-18 DIAGNOSIS — M170 Bilateral primary osteoarthritis of knee: Secondary | ICD-10-CM | POA: Diagnosis not present

## 2024-12-16 DIAGNOSIS — K5900 Constipation, unspecified: Secondary | ICD-10-CM | POA: Diagnosis not present

## 2024-12-16 DIAGNOSIS — R198 Other specified symptoms and signs involving the digestive system and abdomen: Secondary | ICD-10-CM | POA: Diagnosis not present

## 2024-12-16 DIAGNOSIS — J309 Allergic rhinitis, unspecified: Secondary | ICD-10-CM | POA: Diagnosis not present

## 2024-12-16 DIAGNOSIS — K219 Gastro-esophageal reflux disease without esophagitis: Secondary | ICD-10-CM | POA: Diagnosis not present
